# Patient Record
Sex: Male | Born: 1970 | Race: White | Hispanic: No | Marital: Single | Smoking: Never smoker
Health system: Southern US, Community
[De-identification: ages and names within clinical notes are randomized; demographics above are authoritative.]

## PROBLEM LIST (undated history)

## (undated) DIAGNOSIS — I10 Essential (primary) hypertension: Secondary | ICD-10-CM

## (undated) DIAGNOSIS — K2970 Gastritis, unspecified, without bleeding: Secondary | ICD-10-CM

## (undated) DIAGNOSIS — E079 Disorder of thyroid, unspecified: Secondary | ICD-10-CM

## (undated) DIAGNOSIS — M549 Dorsalgia, unspecified: Secondary | ICD-10-CM

## (undated) DIAGNOSIS — F419 Anxiety disorder, unspecified: Secondary | ICD-10-CM

## (undated) DIAGNOSIS — S62109A Fracture of unspecified carpal bone, unspecified wrist, initial encounter for closed fracture: Secondary | ICD-10-CM

## (undated) DIAGNOSIS — G8929 Other chronic pain: Secondary | ICD-10-CM

## (undated) HISTORY — PX: FINGER FRACTURE SURGERY: SHX638

## (undated) HISTORY — PX: APPENDECTOMY: SHX54

---

## 2006-07-17 ENCOUNTER — Emergency Department: Payer: Self-pay | Admitting: General Practice

## 2006-07-20 ENCOUNTER — Other Ambulatory Visit: Payer: Self-pay

## 2006-07-20 ENCOUNTER — Inpatient Hospital Stay: Payer: Self-pay | Admitting: Internal Medicine

## 2006-12-08 ENCOUNTER — Inpatient Hospital Stay: Payer: Self-pay | Admitting: Internal Medicine

## 2006-12-10 ENCOUNTER — Inpatient Hospital Stay: Payer: Self-pay | Admitting: Internal Medicine

## 2007-02-05 ENCOUNTER — Emergency Department: Payer: Self-pay | Admitting: Emergency Medicine

## 2007-05-15 ENCOUNTER — Emergency Department: Payer: Self-pay | Admitting: Emergency Medicine

## 2007-07-16 ENCOUNTER — Emergency Department: Payer: Self-pay | Admitting: Emergency Medicine

## 2008-01-31 ENCOUNTER — Emergency Department: Payer: Self-pay | Admitting: Emergency Medicine

## 2009-06-06 ENCOUNTER — Emergency Department: Payer: Self-pay | Admitting: Emergency Medicine

## 2009-06-21 IMAGING — CT CT ABD-PELV W/ CM
1 of 2 series · 15 of 32 positions shown, 20 images · non-contrast
Comparison: none

REASON FOR EXAM: Diffuse abdominal pain, vomiting, elevated WBC
COMMENTS:

PROCEDURE:     CT  - CT ABDOMEN / PELVIS  W  - May 16, 2007  [DATE]
RESULT:     Emergent CT of the abdomen and pelvis is performed utilizing 100
ml of Rsovue-MLR iodinated intravenous contrast.
Comparison is made to the earlier exam of 12/11/2006.

[Series 2: abdomen · axial · 0.79mm/px · z∈[-920,-504]mm · 15 of 58 slices shown, 20 images]
[im 3/58  soft-tissue]
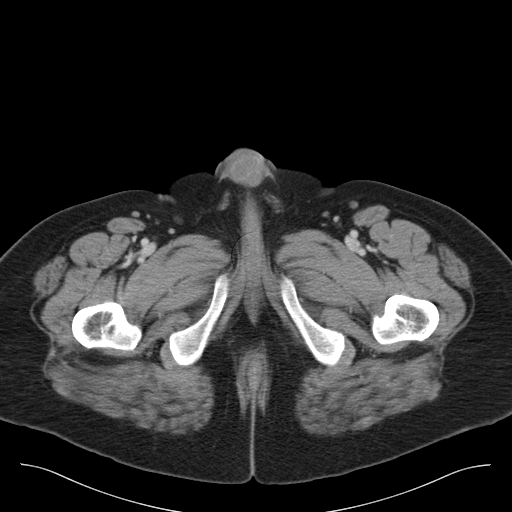
[im 3/58  bone]
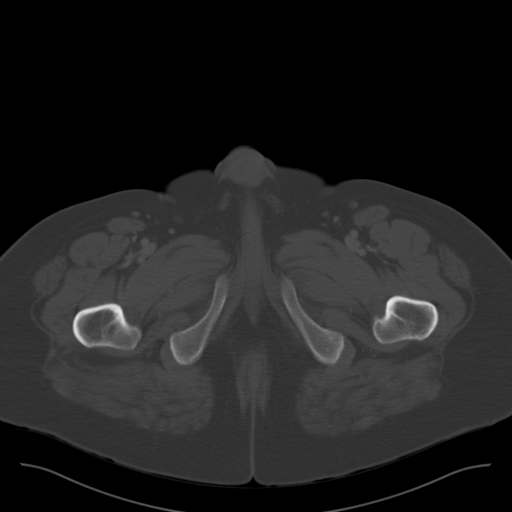
[im 8/58  soft-tissue]
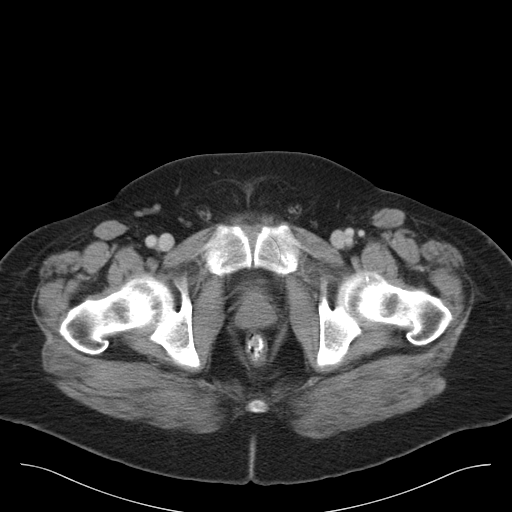
[im 10/58  soft-tissue]
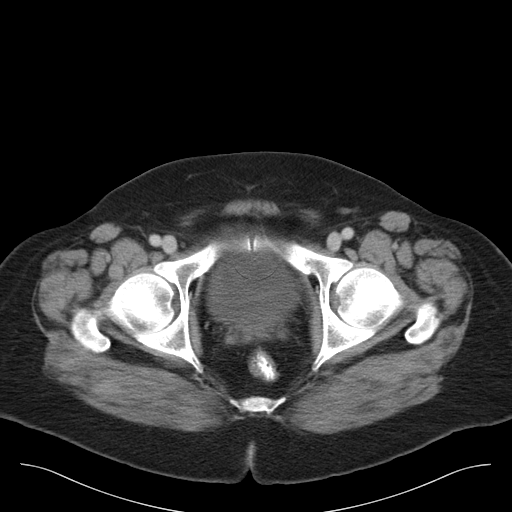
[im 15/58  soft-tissue]
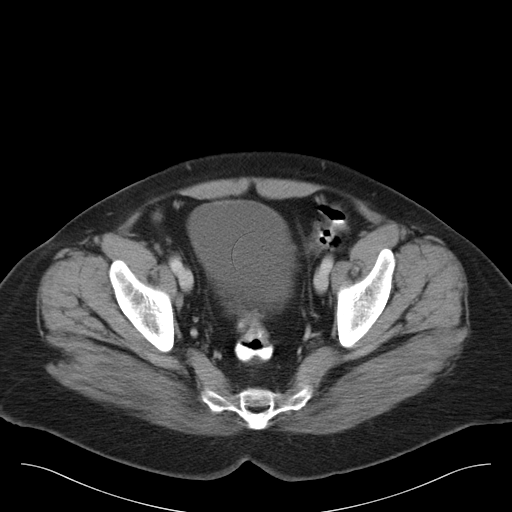
[im 20/58  soft-tissue]
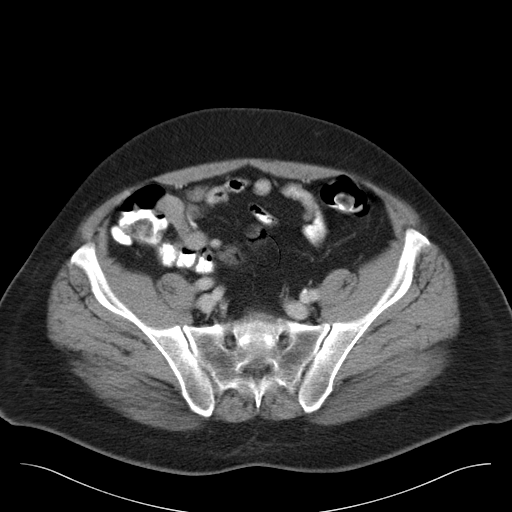
[im 23/58  soft-tissue]
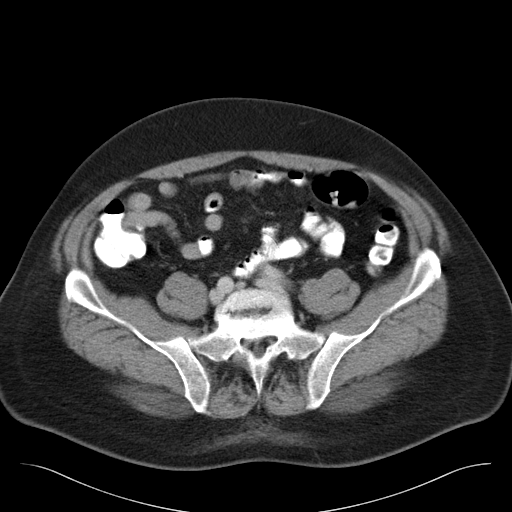
[im 28/58  soft-tissue]
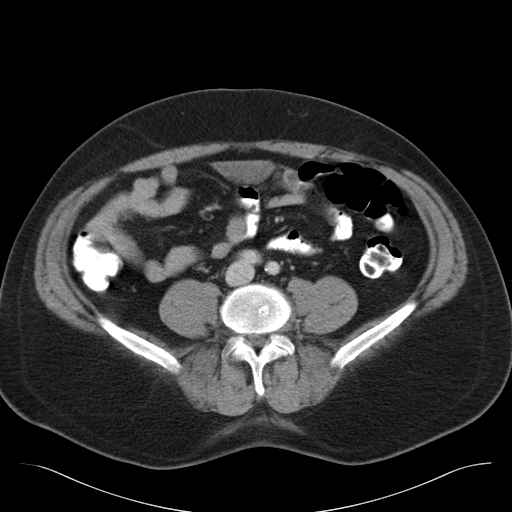
[im 30/58  soft-tissue]
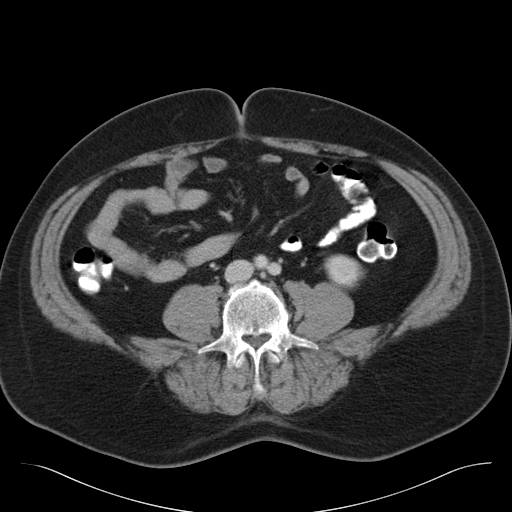
[im 35/58  soft-tissue]
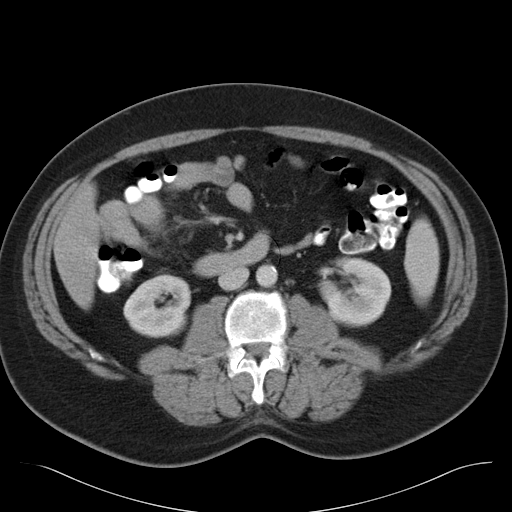
[im 35/58  bone]
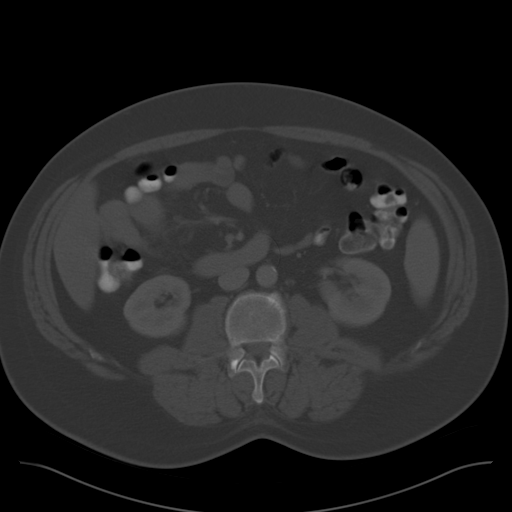
[im 38/58  soft-tissue]
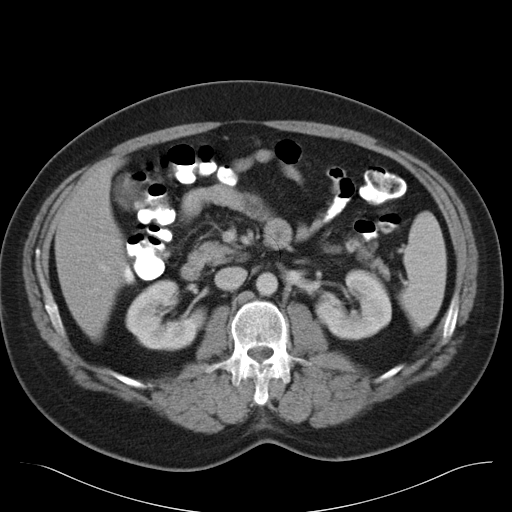
[im 43/58  soft-tissue]
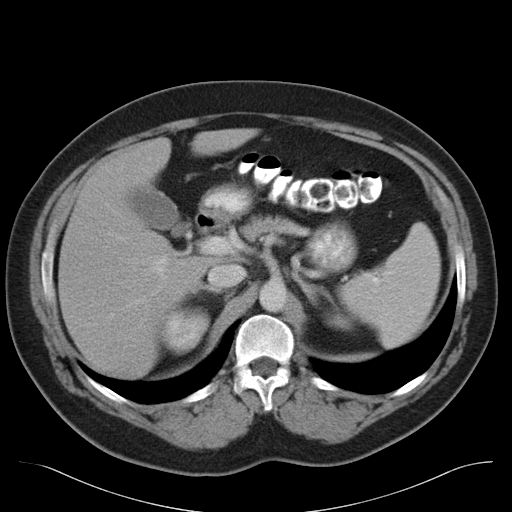
[im 48/58  soft-tissue]
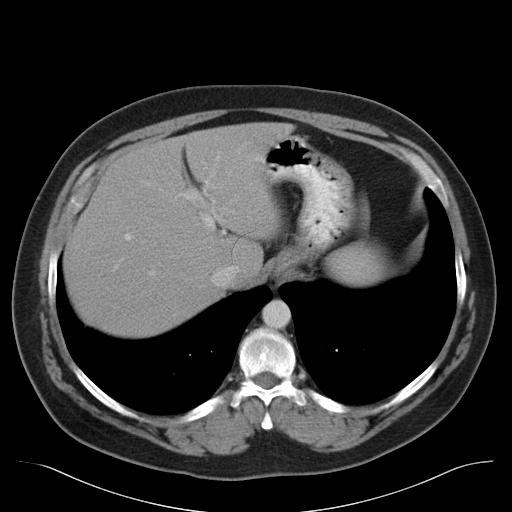
[im 48/58  lung]
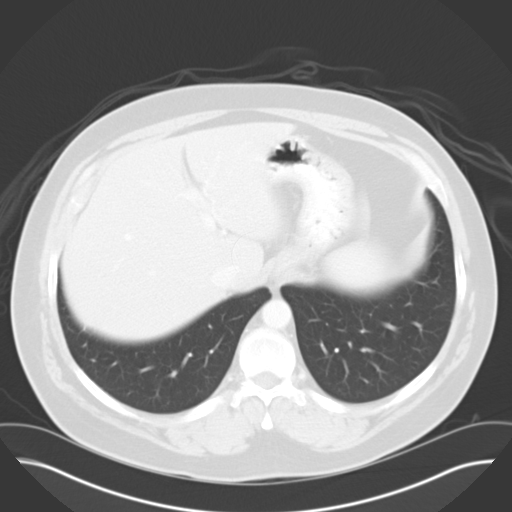
[im 50/58  soft-tissue]
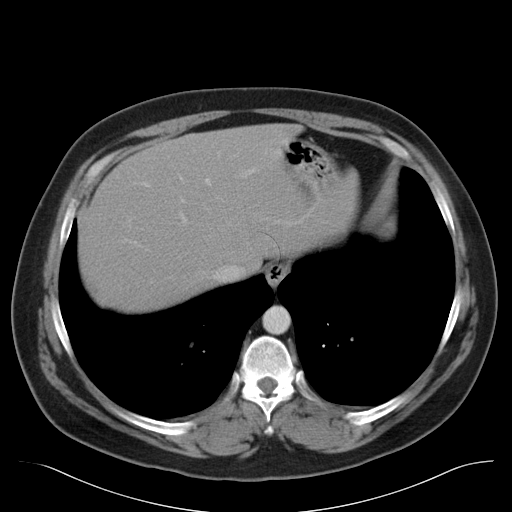
[im 50/58  lung]
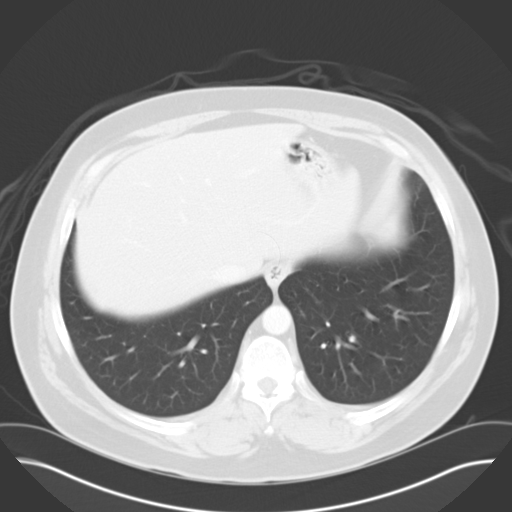
[im 53/58  lung]
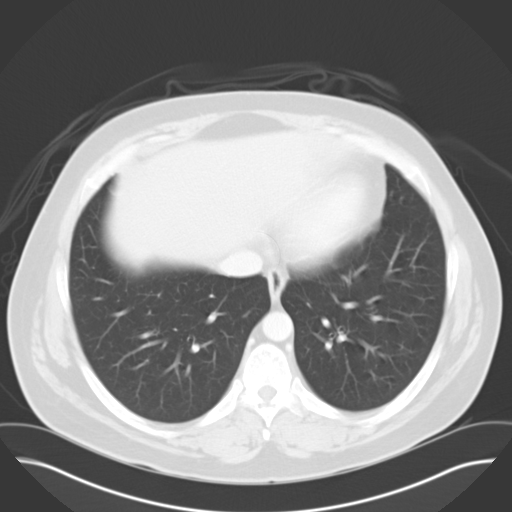
[im 55/58  soft-tissue]
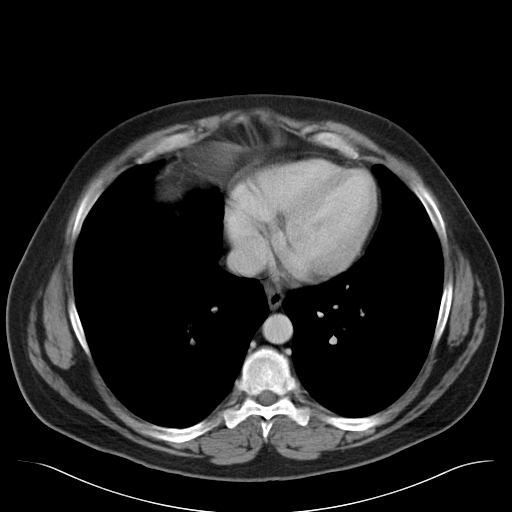
[im 55/58  lung]
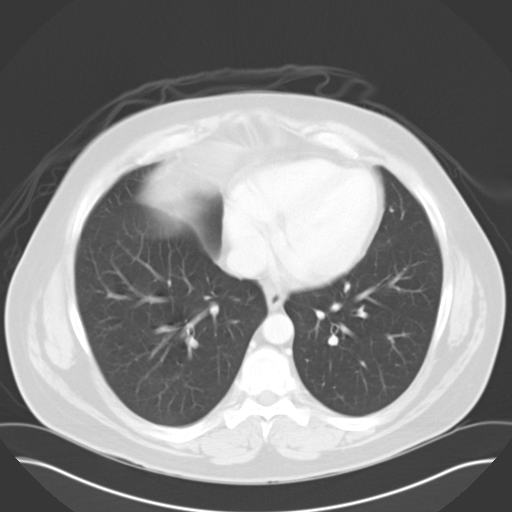

[15 of 32 positions shown; findings below may reference images not displayed]

FINDINGS: The lung bases are clear. The heart is not enlarged. There is no
pleural or pericardial effusion. The liver, spleen, gallbladder, pancreas,
adrenal glands, kidneys, bowel and abdominal aorta appear to be
unremarkable. There is no acute inflammation. The urinary bladder is
nondistended. There is no CT evidence of acute appendicitis. There is no
free air, free fluid or abnormal fluid collection.
IMPRESSION: 1.     No acute abnormality. No gallstones, renal stones or acute
inflammation is evident.
2.     There is no evidence of bowel obstruction.

## 2009-11-25 ENCOUNTER — Observation Stay: Payer: Self-pay | Admitting: Internal Medicine

## 2010-09-15 ENCOUNTER — Emergency Department: Payer: Self-pay | Admitting: Emergency Medicine

## 2011-12-30 ENCOUNTER — Emergency Department: Payer: Self-pay | Admitting: Emergency Medicine

## 2011-12-30 LAB — COMPREHENSIVE METABOLIC PANEL
Anion Gap: 7 (ref 7–16)
BUN: 20 mg/dL — ABNORMAL HIGH (ref 7–18)
Calcium, Total: 9.3 mg/dL (ref 8.5–10.1)
Chloride: 94 mmol/L — ABNORMAL LOW (ref 98–107)
Co2: 27 mmol/L (ref 21–32)
EGFR (African American): >60
Osmolality: 261 (ref 275–301)
Potassium: 4.3 mmol/L (ref 3.5–5.1)
SGOT(AST): 73 U/L — ABNORMAL HIGH (ref 15–37)
SGPT (ALT): 117 U/L — ABNORMAL HIGH (ref 12–78)
Sodium: 128 mmol/L — ABNORMAL LOW (ref 136–145)
Total Protein: 8.7 g/dL — ABNORMAL HIGH (ref 6.4–8.2)

## 2011-12-30 LAB — CBC
RDW: 14.2 % (ref 11.5–14.5)
WBC: 17.3 10*3/uL — ABNORMAL HIGH (ref 3.8–10.6)

## 2011-12-30 LAB — URIC ACID: Uric Acid: 4.3 mg/dL (ref 3.5–7.2)

## 2013-01-20 ENCOUNTER — Emergency Department: Payer: Self-pay | Admitting: Emergency Medicine

## 2013-01-20 LAB — CBC WITH DIFFERENTIAL/PLATELET
BASOS ABS: 0.1 10*3/uL (ref 0.0–0.1)
Basophil %: 0.6 %
EOS ABS: 0.2 10*3/uL (ref 0.0–0.7)
Eosinophil %: 1 %
HCT: 41.1 % (ref 40.0–52.0)
HGB: 13.7 g/dL (ref 13.0–18.0)
Lymphocyte #: 3.3 10*3/uL (ref 1.0–3.6)
Lymphocyte %: 16.8 %
MCH: 29.8 pg (ref 26.0–34.0)
MCHC: 33.4 g/dL (ref 32.0–36.0)
MCV: 89 fL (ref 80–100)
MONOS PCT: 4.4 %
Monocyte #: 0.9 x10 3/mm (ref 0.2–1.0)
NEUTROS ABS: 15.1 10*3/uL — AB (ref 1.4–6.5)
NEUTROS PCT: 77.2 %
Platelet: 263 10*3/uL (ref 150–440)
RBC: 4.61 10*6/uL (ref 4.40–5.90)
RDW: 13.1 % (ref 11.5–14.5)
WBC: 19.5 10*3/uL — ABNORMAL HIGH (ref 3.8–10.6)

## 2013-01-20 LAB — COMPREHENSIVE METABOLIC PANEL
ALT: 31 U/L (ref 12–78)
AST: 30 U/L (ref 15–37)
Albumin: 4.1 g/dL (ref 3.4–5.0)
Alkaline Phosphatase: 78 U/L
Anion Gap: 8 (ref 7–16)
BUN: 28 mg/dL — ABNORMAL HIGH (ref 7–18)
Bilirubin,Total: 1.4 mg/dL — ABNORMAL HIGH (ref 0.2–1.0)
CALCIUM: 8.9 mg/dL (ref 8.5–10.1)
Chloride: 98 mmol/L (ref 98–107)
Co2: 26 mmol/L (ref 21–32)
Creatinine: 1.22 mg/dL (ref 0.60–1.30)
EGFR (Non-African Amer.): >60
Glucose: 112 mg/dL — ABNORMAL HIGH (ref 65–99)
Osmolality: 271 (ref 275–301)
POTASSIUM: 3.5 mmol/L (ref 3.5–5.1)
Sodium: 132 mmol/L — ABNORMAL LOW (ref 136–145)
TOTAL PROTEIN: 7.8 g/dL (ref 6.4–8.2)

## 2013-01-20 LAB — TROPONIN I: Troponin-I: <0.02 ng/mL

## 2013-01-20 LAB — RAPID INFLUENZA A&B ANTIGENS

## 2013-01-20 LAB — LIPASE, BLOOD: Lipase: 79 U/L (ref 73–393)

## 2013-01-21 LAB — URINALYSIS, COMPLETE
BILIRUBIN, UR: NEGATIVE
Bacteria: NONE SEEN
Blood: NEGATIVE
Glucose,UR: NEGATIVE mg/dL (ref 0–75)
Leukocyte Esterase: NEGATIVE
NITRITE: NEGATIVE
Ph: 5 (ref 4.5–8.0)
Protein: 30
RBC,UR: 1 /HPF (ref 0–5)
SPECIFIC GRAVITY: 1.029 (ref 1.003–1.030)
Squamous Epithelial: NONE SEEN
WBC UR: 1 /HPF (ref 0–5)

## 2014-06-05 ENCOUNTER — Ambulatory Visit
Admission: RE | Admit: 2014-06-05 | Discharge: 2014-06-05 | Disposition: A | Discharge status: Home / Self Care | Payer: Disability Insurance | Source: Ambulatory Visit | Attending: Family Medicine | Admitting: Family Medicine

## 2014-06-05 ENCOUNTER — Other Ambulatory Visit: Payer: Self-pay | Admitting: Family Medicine

## 2014-06-05 DIAGNOSIS — M549 Dorsalgia, unspecified: Secondary | ICD-10-CM

## 2014-12-24 ENCOUNTER — Encounter: Payer: Self-pay | Admitting: *Deleted

## 2014-12-24 ENCOUNTER — Emergency Department: Payer: Commercial Managed Care - HMO

## 2014-12-24 ENCOUNTER — Emergency Department
Admission: EM | Admit: 2014-12-24 | Discharge: 2014-12-24 | Disposition: A | Discharge status: Home / Self Care | Payer: Commercial Managed Care - HMO | Attending: Emergency Medicine | Admitting: Emergency Medicine

## 2014-12-24 DIAGNOSIS — S63501A Unspecified sprain of right wrist, initial encounter: Secondary | ICD-10-CM | POA: Diagnosis not present

## 2014-12-24 DIAGNOSIS — W010XXA Fall on same level from slipping, tripping and stumbling without subsequent striking against object, initial encounter: Secondary | ICD-10-CM | POA: Diagnosis not present

## 2014-12-24 DIAGNOSIS — Z880 Allergy status to penicillin: Secondary | ICD-10-CM | POA: Insufficient documentation

## 2014-12-24 DIAGNOSIS — S60211A Contusion of right wrist, initial encounter: Secondary | ICD-10-CM | POA: Diagnosis not present

## 2014-12-24 DIAGNOSIS — Y9389 Activity, other specified: Secondary | ICD-10-CM | POA: Diagnosis not present

## 2014-12-24 DIAGNOSIS — S6991XA Unspecified injury of right wrist, hand and finger(s), initial encounter: Secondary | ICD-10-CM | POA: Diagnosis present

## 2014-12-24 DIAGNOSIS — Y9289 Other specified places as the place of occurrence of the external cause: Secondary | ICD-10-CM | POA: Insufficient documentation

## 2014-12-24 DIAGNOSIS — Y998 Other external cause status: Secondary | ICD-10-CM | POA: Insufficient documentation

## 2014-12-24 HISTORY — DX: Fracture of unspecified carpal bone, unspecified wrist, initial encounter for closed fracture: S62.109A

## 2014-12-24 HISTORY — DX: Gastritis, unspecified, without bleeding: K29.70

## 2014-12-24 NOTE — ED Notes (Signed)
Patient states he fell out of the shower after slipping on conditioner and landed on right wrist.

## 2014-12-24 NOTE — Discharge Instructions (Signed)
Periosteal Hematoma °Periosteal hematoma (bone bruise) is a localized, tender, raised area close to the bone. It can occur from a small hidden fracture of the bone, following surgery, or from other trauma to the area. It typically occurs in bones located close to the surface of the skin, such as the shin, knee, and heel bone. Although it may take 2 or more weeks to completely heal, bone bruises typically are not associated with permanent or serious damage to the bone. If you are taking blood thinners, you may be at greater risk for such injuries.  °CAUSES  °A bone bruise is usually caused by high-impact trauma to the bone, but it can be caused by sports injuries or twisting injuries. °SIGNS AND SYMPTOMS  °· Severe pain around the injured area that typically lasts longer than a normal bruise. °· Difficulty using the bruised area. °· Tender, raised area close to the bone. °· Discoloration or swelling of the bruised area. °DIAGNOSIS  °You may need an MRI of the injured area to confirm a bone bruise if your health care provider feels it is necessary. A regular X-ray will not detect a bone bruise, but it will detect a broken bone (fracture). An X-ray may be taken to rule out any fractures. °TREATMENT  °Often, the best treatment for a bone bruise is resting, icing, and applying cold compresses to the injured area. Over-the-counter medicines may also be recommended for pain control. °HOME CARE INSTRUCTIONS  °Some things you can do to improve the condition are:  °· Rest and elevate the area of injury as long as it is very tender or swollen. °· Apply ice to the injured area: °· Put ice in a plastic bag. °· Place a towel between your skin and the bag. °· Leave the ice on for 20 minutes, 2-3 times a day. °· Use an elastic wrap to reduce swelling and protect the injured area. Make sure it is not applied too tightly. If the area around the wrap becomes cold or blue, the wrap is too tight. Wrap it more loosely. °· For  activity: °· Follow your health care provider's instructions about whether walking with crutches is required. This will depend on how serious your condition is. °· Start weight bearing gradually on the bruised part. °· Continue to use crutches or a cane until you can stand without causing pain, or as instructed. °· If a plaster splint was applied: °· Wear the splint until you are seen for a follow-up exam. °· Rest it on nothing harder than a pillow the first 24 hours. °· Do not put weight on it. °· Do not get it wet. You may take it off to take a shower or bath. °· You may have been given an elastic bandage to use with or without the plaster splint. The splint is too tight if you have numbness or tingling, or if the skin around the bandage becomes cold and blue. Adjust the bandage to make it comfortable. °· If an air splint was applied: °· You may alter the amount of air in the splint as needed for comfort. °· You may take it off at night and to take a shower or bath. °· If the injury was in either leg, wiggle your toes in the splint several times per day if you are able. °· Only take over-the-counter or prescription medicines for pain, discomfort, or fever as directed by your health care provider. °· Keep all follow-up visits with your health care provider. This includes   any orthopedic referrals, physical therapy, and rehabilitation. Any delay in getting necessary care could result in a delay or failure of the bones to heal. SEEK MEDICAL CARE IF:   You have an increase in bruising, swelling, tenderness, heat, or pain over your injury.  You notice coldness of your toes that does not improve after removing a splint or bandage.  Your pain is not lessened after you take medicine.  You have increased difficulty bearing weight on the injured leg, if the injury is in either leg. SEEK IMMEDIATE MEDICAL CARE IF:   You have severe pain near the injured area or severe pain with stretching.  You have increased  swelling that resulted in a tense, hard area or a loss of sensation in the area of the injury.  You have pale, cool skin below the area of the injury (in an extremity) that does not go away after removing a splint or bandage. MAKE SURE YOU:   Understand these instructions.  Will watch your condition.  Will get help right away if you are not doing well or get worse.   This information is not intended to replace advice given to you by your health care provider. Make sure you discuss any questions you have with your health care provider.   Document Released: 02/14/2004 Document Revised: 10/27/2012 Document Reviewed: 06/25/2012 Elsevier Interactive Patient Education 2016 [employer]  Wrist Sprain With Rehab A sprain is an injury in which a ligament that maintains the proper alignment of a joint is partially or completely torn. The ligaments of the wrist are susceptible to sprains. Sprains are classified into three categories. Grade 1 sprains cause pain, but the tendon is not lengthened. Grade 2 sprains include a lengthened ligament because the ligament is stretched or partially ruptured. With grade 2 sprains there is still function, although the function may be diminished. Grade 3 sprains are characterized by a complete tear of the tendon or muscle, and function is usually impaired. SYMPTOMS   Pain tenderness, inflammation, and/or bruising (contusion) of the injury.  A "pop" or tear felt and/or heard at the time of injury.  Decreased wrist function. CAUSES  A wrist sprain occurs when a force is placed on one or more ligaments that is greater than it/they can withstand. Common mechanisms of injury include:  Catching a ball with your hands.  Repetitive and/ or strenuous extension or flexion of the wrist. RISK INCREASES WITH:  Previous wrist injury.  Contact sports (boxing or wrestling).  Activities in which falling is common.  Poor strength and flexibility.  Improperly fitted or  padded protective equipment. PREVENTION  Warm up and stretch properly before activity.  Allow for adequate recovery between workouts.  Maintain physical fitness:  Strength, flexibility, and endurance.  Cardiovascular fitness.  Protect the wrist joint by limiting its motion with the use of taping, braces, or splints.  Protect the wrist after injury for 6 to 12 months. PROGNOSIS  The prognosis for wrist sprains depends on the degree of injury. Grade 1 sprains require 2 to 6 weeks of treatment. Grade 2 sprains require 6 to 8 weeks of treatment, and grade 3 sprains require up to 12 weeks.  RELATED COMPLICATIONS   Prolonged healing time, if improperly treated or re-injured.  Recurrent symptoms that result in a chronic problem.  Injury to nearby structures (bone, cartilage, nerves, or tendons).  Arthritis of the wrist.  Inability to compete in athletics at a high level.  Wrist stiffness or weakness.  Progression to a  complete rupture of the ligament. TREATMENT  Treatment initially involves resting from any activities that aggravate the symptoms, and the use of ice and medications to help reduce pain and inflammation. Your caregiver may recommend immobilizing the wrist for a period of time in order to reduce stress on the ligament and allow for healing. After immobilization it is important to perform strengthening and stretching exercises to help regain strength and a full range of motion. These exercises may be completed at home or with a therapist. Surgery is not usually required for wrist sprains, unless the ligament has been ruptured (grade 3 sprain). MEDICATION   If pain medication is necessary, then nonsteroidal anti-inflammatory medications, such as aspirin and ibuprofen, or other minor pain relievers, such as acetaminophen, are often recommended.  Do not take pain medication for 7 days before surgery.  Prescription pain relievers may be given if deemed necessary by your  caregiver. Use only as directed and only as much as you need. HEAT AND COLD  Cold treatment (icing) relieves pain and reduces inflammation. Cold treatment should be applied for 10 to 15 minutes every 2 to 3 hours for inflammation and pain and immediately after any activity that aggravates your symptoms. Use ice packs or massage the area with a piece of ice (ice massage).  Heat treatment may be used prior to performing the stretching and strengthening activities prescribed by your caregiver, physical therapist, or athletic trainer. Use a heat pack or soak your injury in warm water. SEEK MEDICAL CARE IF:  Treatment seems to offer no benefit, or the condition worsens.  Any medications produce adverse side effects.

## 2014-12-24 NOTE — ED Notes (Signed)
Pt fell in the shower tonight.  Pt has right wrist pain.  Swelling noted.  Denies other injury at this time.

## 2014-12-24 NOTE — ED Provider Notes (Signed)
[facility] Emergency Department Provider Note  ____________________________________________  Time seen: Approximately 7:31 PM  I have reviewed the triage vital signs and the nursing notes.   HISTORY  Chief Complaint Fall    HPI Kevin Schmitt is a 44 y.o. male who presents to emergency department complaining ofright wrist pain. He states that he was in the shower when he slipped falling on her outstretched hand. He reports immediate severe pain to the distal ulna and radius. He reports swelling to the same area. He states that he does have a history of a greenstick fracture to distal ulna approximately 25 years prior. He denies numbness or tingling in his fingers. He denies hitting his head or losing consciousness. He states the pain is sharp, severe at the time of incident but moderate now, and worse with movement. Patient states that he has a pain management contract for chronic lumbago and narcotic use is dulling the pain.   Past Medical History  Diagnosis Date  . Gastritis   . Wrist fracture right    There are no active problems to display for this patient.   Past Surgical History  Procedure Laterality Date  . Appendectomy    . Finger fracture surgery      No current outpatient prescriptions on file.  Allergies Penicillins  History reviewed. No pertinent family history.  Social History Social History  Substance Use Topics  . Smoking status: Never Smoker   . Smokeless tobacco: None  . Alcohol Use: No    Review of Systems Constitutional: No fever/chills Eyes: No visual changes. ENT: No sore throat. Cardiovascular: Denies chest pain. Respiratory: Denies shortness of breath. Gastrointestinal: No abdominal pain.  No nausea, no vomiting.  No diarrhea.  No constipation. Genitourinary: Negative for dysuria. Musculoskeletal: Negative for back pain. Patient endorses right wrist pain. Skin: Negative for rash. Neurological: Negative for  headaches, focal weakness or numbness.  10-point ROS otherwise negative.  ____________________________________________   PHYSICAL EXAM:  VITAL SIGNS: ED Triage Vitals  Enc Vitals Group     BP 12/24/14 1900 130/69 mmHg     Pulse Rate 12/24/14 1900 54     Resp 12/24/14 1900 18     Temp 12/24/14 1900 98.5 F (36.9 C)     Temp Source 12/24/14 1900 Oral     SpO2 12/24/14 1900 99 %     Weight 12/24/14 1900 220 lb (99.791 kg)     Height 12/24/14 1900  (1.778 m)     Head Cir --      Peak Flow --      Pain Score 12/24/14 1901 4     Pain Loc --      Pain Edu? --      Excl. in GC? --     Constitutional: Alert and oriented. Well appearing and in no acute distress. Eyes: Conjunctivae are normal. PERRL. EOMI. Head: Atraumatic. Nose: No congestion/rhinnorhea. Mouth/Throat: Mucous membranes are moist.  Oropharynx non-erythematous. Neck: No stridor.   Cardiovascular: Normal rate, regular rhythm. Grossly normal heart sounds.  Good peripheral circulation. Respiratory: Normal respiratory effort.  No retractions. Lungs CTAB. Gastrointestinal: Soft and nontender. No distention. No abdominal bruits. No CVA tenderness. Musculoskeletal: No lower extremity tenderness nor edema.  No joint effusions. Edema noted to distal radius and ulna with compared with left side. Limited range of motion of wrist due to pain. Tenderness to palpation over distal ulna and radius, however tenderness is worse over the ulna. Neurologic:  Normal speech and language.  No gross focal neurologic deficits are appreciated. No gait instability. Skin:  Skin is warm, dry and intact. No rash noted. Psychiatric: Mood and affect are normal. Speech and behavior are normal.  ____________________________________________   LABS (all labs ordered are listed, but only abnormal results are displayed)  Labs Reviewed - No data to  display ____________________________________________  EKG   ____________________________________________  RADIOLOGY  Right wrist x-ray Impression: No acute fracture. No dislocation. ____________________________________________   PROCEDURES  Procedure(s) performed: Yes, joint immobilization, see procedure note(s).   SPLINT APPLICATION Date/Time: 8:38 PM Authorized by: Racheal PatchesJonathan D Cuthriell Consent: Verbal consent obtained. Risks and benefits: risks, benefits and alternatives were discussed Consent given by: patient Splint applied by: Gala RomneyJonathan Cuthriell, PA-C Location details: Right wrist  Splint type: Wrist cock up  Supplies used: Prefabricated wrist brace  Post-procedure: The splinted body part was neurovascularly unchanged following the procedure. Patient tolerance: Patient tolerated the procedure well with no immediate complications.     Critical Care performed: No  ____________________________________________   INITIAL IMPRESSION / ASSESSMENT AND PLAN / ED COURSE  Pertinent labs & imaging results that were available during my care of the patient were reviewed by me and considered in my medical decision making (see chart for details).  Patient's diagnosis is consistent with right wrist sprain and contusion. Patient is given a prefabricated wrist brace here in emergency department. Patient is on pain management contract and is advised to continue readily prescribed medications for same. Patient may take ibuprofen in addition to narcotics for symptom control. Patient is to follow-up with orthopedics should symptoms persist. ____________________________________________   FINAL CLINICAL IMPRESSION(S) / ED DIAGNOSES  Final diagnoses:  Wrist sprain, right, initial encounter  Wrist contusion, right, initial encounter      Racheal PatchesJonathan D Cuthriell, PA-C 12/24/14 2038  Sharman CheekPhillip Stafford, MD 12/24/14 2311

## 2015-06-17 ENCOUNTER — Emergency Department: Payer: Medicare PPO

## 2015-06-17 ENCOUNTER — Emergency Department
Admission: EM | Admit: 2015-06-17 | Discharge: 2015-06-17 | Disposition: A | Discharge status: Home / Self Care | Payer: Medicare PPO | Attending: Emergency Medicine | Admitting: Emergency Medicine

## 2015-06-17 ENCOUNTER — Encounter: Payer: Self-pay | Admitting: Emergency Medicine

## 2015-06-17 DIAGNOSIS — R112 Nausea with vomiting, unspecified: Secondary | ICD-10-CM | POA: Diagnosis present

## 2015-06-17 DIAGNOSIS — K2900 Acute gastritis without bleeding: Secondary | ICD-10-CM | POA: Insufficient documentation

## 2015-06-17 DIAGNOSIS — I10 Essential (primary) hypertension: Secondary | ICD-10-CM | POA: Insufficient documentation

## 2015-06-17 DIAGNOSIS — F1290 Cannabis use, unspecified, uncomplicated: Secondary | ICD-10-CM | POA: Insufficient documentation

## 2015-06-17 DIAGNOSIS — R109 Unspecified abdominal pain: Secondary | ICD-10-CM

## 2015-06-17 DIAGNOSIS — D72829 Elevated white blood cell count, unspecified: Secondary | ICD-10-CM | POA: Diagnosis not present

## 2015-06-17 DIAGNOSIS — E079 Disorder of thyroid, unspecified: Secondary | ICD-10-CM | POA: Insufficient documentation

## 2015-06-17 DIAGNOSIS — Z79899 Other long term (current) drug therapy: Secondary | ICD-10-CM | POA: Insufficient documentation

## 2015-06-17 DIAGNOSIS — R110 Nausea: Secondary | ICD-10-CM

## 2015-06-17 HISTORY — DX: Anxiety disorder, unspecified: F41.9

## 2015-06-17 HISTORY — DX: Essential (primary) hypertension: I10

## 2015-06-17 HISTORY — DX: Disorder of thyroid, unspecified: E07.9

## 2015-06-17 LAB — COMPREHENSIVE METABOLIC PANEL
ALT: 28 U/L (ref 17–63)
AST: 37 U/L (ref 15–41)
Albumin: 4.4 g/dL (ref 3.5–5.0)
Alkaline Phosphatase: 70 U/L (ref 38–126)
Anion gap: 15 (ref 5–15)
BILIRUBIN TOTAL: 2.6 mg/dL — AB (ref 0.3–1.2)
BUN: 26 mg/dL — ABNORMAL HIGH (ref 6–20)
CALCIUM: 9 mg/dL (ref 8.9–10.3)
CO2: 23 mmol/L (ref 22–32)
Chloride: 93 mmol/L — ABNORMAL LOW (ref 101–111)
Creatinine, Ser: 1.38 mg/dL — ABNORMAL HIGH (ref 0.61–1.24)
GFR calc Af Amer: >60 mL/min (ref >60)
GFR, EST NON AFRICAN AMERICAN: 60 mL/min — AB (ref >60)
Glucose, Bld: 116 mg/dL — ABNORMAL HIGH (ref 65–99)
Potassium: 3.6 mmol/L (ref 3.5–5.1)
Sodium: 131 mmol/L — ABNORMAL LOW (ref 135–145)
Total Protein: 7.8 g/dL (ref 6.5–8.1)

## 2015-06-17 LAB — CBC WITH DIFFERENTIAL/PLATELET
Basophils Absolute: 0.1 10*3/uL (ref 0–0.1)
Eosinophils Absolute: 0 10*3/uL (ref 0–0.7)
Eosinophils Relative: 0 %
HEMATOCRIT: 40.2 % (ref 40.0–52.0)
HEMOGLOBIN: 13.6 g/dL (ref 13.0–18.0)
Lymphocytes Relative: 13 %
Lymphs Abs: 2.1 10*3/uL (ref 1.0–3.6)
MCH: 30.2 pg (ref 26.0–34.0)
MCHC: 33.7 g/dL (ref 32.0–36.0)
MCV: 89.5 fL (ref 80.0–100.0)
Monocytes Absolute: 1 10*3/uL (ref 0.2–1.0)
NEUTROS ABS: 12.9 10*3/uL — AB (ref 1.4–6.5)
Platelets: 179 10*3/uL (ref 150–440)
RBC: 4.49 MIL/uL (ref 4.40–5.90)
RDW: 13.2 % (ref 11.5–14.5)
WBC: 16.1 10*3/uL — AB (ref 3.8–10.6)

## 2015-06-17 LAB — URINALYSIS COMPLETE WITH MICROSCOPIC (ARMC ONLY)
Bilirubin Urine: NEGATIVE
Glucose, UA: NEGATIVE mg/dL
Leukocytes, UA: NEGATIVE
NITRITE: NEGATIVE
Protein, ur: 30 mg/dL — AB
RBC / HPF: NONE SEEN RBC/hpf (ref 0–5)
SPECIFIC GRAVITY, URINE: 1.008 (ref 1.005–1.030)
Squamous Epithelial / LPF: NONE SEEN
pH: 5 (ref 5.0–8.0)

## 2015-06-17 LAB — LIPASE, BLOOD: Lipase: 18 U/L (ref 11–51)

## 2015-06-17 LAB — CBC
HCT: 41.2 % (ref 40.0–52.0)
HEMOGLOBIN: 13.9 g/dL (ref 13.0–18.0)
MCH: 30.5 pg (ref 26.0–34.0)
MCHC: 33.8 g/dL (ref 32.0–36.0)
MCV: 90.2 fL (ref 80.0–100.0)
Platelets: 213 10*3/uL (ref 150–440)
RBC: 4.56 MIL/uL (ref 4.40–5.90)
RDW: 13.1 % (ref 11.5–14.5)
WBC: 23.2 10*3/uL — ABNORMAL HIGH (ref 3.8–10.6)

## 2015-06-17 MED ORDER — HALOPERIDOL LACTATE 5 MG/ML IJ SOLN
1.0000 mg | Freq: Once | INTRAMUSCULAR | Status: AC
  Administered 2015-06-17: 1 mg via INTRAVENOUS
  Filled 2015-06-17: qty 1

## 2015-06-17 MED ORDER — SODIUM CHLORIDE 0.9 % IV BOLUS (SEPSIS)
1000.0000 mL | INTRAVENOUS | Status: AC
  Administered 2015-06-17: 1000 mL via INTRAVENOUS

## 2015-06-17 MED ORDER — LORAZEPAM 2 MG/ML IJ SOLN
1.0000 mg | Freq: Once | INTRAMUSCULAR | Status: AC
  Administered 2015-06-17: 1 mg via INTRAVENOUS
  Filled 2015-06-17: qty 1

## 2015-06-17 MED ORDER — ONDANSETRON HCL 4 MG/2ML IJ SOLN
4.0000 mg | Freq: Once | INTRAMUSCULAR | Status: AC | PRN
  Administered 2015-06-17: 4 mg via INTRAVENOUS
  Filled 2015-06-17: qty 2

## 2015-06-17 MED ORDER — MORPHINE SULFATE (PF) 4 MG/ML IV SOLN
8.0000 mg | Freq: Once | INTRAVENOUS | Status: AC
  Administered 2015-06-17: 8 mg via INTRAVENOUS
  Filled 2015-06-17: qty 2

## 2015-06-17 MED ORDER — DIPHENHYDRAMINE HCL 50 MG/ML IJ SOLN
25.0000 mg | Freq: Once | INTRAMUSCULAR | Status: AC
  Administered 2015-06-17: 25 mg via INTRAVENOUS
  Filled 2015-06-17: qty 1

## 2015-06-17 MED ORDER — ONDANSETRON HCL 4 MG/2ML IJ SOLN
4.0000 mg | Freq: Once | INTRAMUSCULAR | Status: AC
  Administered 2015-06-17: 4 mg via INTRAVENOUS
  Filled 2015-06-17: qty 2

## 2015-06-17 MED ORDER — MORPHINE SULFATE (PF) 4 MG/ML IV SOLN
6.0000 mg | Freq: Once | INTRAVENOUS | Status: AC
  Administered 2015-06-17: 6 mg via INTRAVENOUS
  Filled 2015-06-17: qty 2

## 2015-06-17 MED ORDER — SODIUM CHLORIDE 0.9 % IV BOLUS (SEPSIS)
1000.0000 mL | Freq: Once | INTRAVENOUS | Status: AC
  Administered 2015-06-17: 1000 mL via INTRAVENOUS

## 2015-06-17 MED ORDER — ONDANSETRON HCL 4 MG/2ML IJ SOLN
INTRAMUSCULAR | Status: AC
  Filled 2015-06-17: qty 2

## 2015-06-17 MED ORDER — DIATRIZOATE MEGLUMINE & SODIUM 66-10 % PO SOLN
15.0000 mL | Freq: Once | ORAL | Status: AC
  Administered 2015-06-17: 15 mL via ORAL

## 2015-06-17 MED ORDER — IOPAMIDOL (ISOVUE-300) INJECTION 61%
100.0000 mL | Freq: Once | INTRAVENOUS | Status: AC | PRN
  Administered 2015-06-17: 100 mL via INTRAVENOUS

## 2015-06-17 NOTE — Discharge Instructions (Signed)
Abdominal Pain, Adult Many things can cause abdominal pain. Usually, abdominal pain is not caused by a disease and will improve without treatment. It can often be observed and treated at home. Your health care provider will do a physical exam and possibly order blood tests and X-rays to help determine the seriousness of your pain. However, in many cases, more time must pass before a clear cause of the pain can be found. Before that point, your health care provider may not know if you need more testing or further treatment. HOME CARE INSTRUCTIONS Monitor your abdominal pain for any changes. The following actions may help to alleviate any discomfort you are experiencing:  Only take over-the-counter or prescription medicines as directed by your health care provider.  Do not take laxatives unless directed to do so by your health care provider.  Try a clear liquid diet (broth, tea, or water) as directed by your health care provider. Slowly move to a bland diet as tolerated. SEEK MEDICAL CARE IF:  You have unexplained abdominal pain.  You have abdominal pain associated with nausea or diarrhea.  You have pain when you urinate or have a bowel movement.  You experience abdominal pain that wakes you in the night.  You have abdominal pain that is worsened or improved by eating food.  You have abdominal pain that is worsened with eating fatty foods.  You have a fever. SEEK IMMEDIATE MEDICAL CARE IF:  Your pain does not go away within 2 hours.  You keep throwing up (vomiting).  Your pain is felt only in portions of the abdomen, such as the right side or the left lower portion of the abdomen.  You pass bloody or black tarry stools. MAKE SURE YOU:  Understand these instructions.  Will watch your condition.  Will get help right away if you are not doing well or get worse.   This information is not intended to replace advice given to you by your health care provider. Make sure you discuss  any questions you have with your health care provider.   Document Released: 10/16/2004 Document Revised: 09/27/2014 Document Reviewed: 09/15/2012 Elsevier Interactive Patient Education [employer]2016 [employer]   Please return if you're worse. Resume all your medicines. Don't forget to pick up the antibiotic from pharmacy. I would get Dr. Mechele CollinElliott to refer you to the hematologist to evaluate her chronically elevated white blood count. Do not have anybody that is being on-call for them today.

## 2015-06-17 NOTE — ED Notes (Signed)
Pt reports that he has been vomiting since Tuesday. Pt reports that he has taken no medication including BP meds and Xanax. Pt appears anxious and trembling but is a/o with NAD at this time in triage.

## 2015-06-17 NOTE — ED Provider Notes (Signed)
[facility] Emergency Department Provider Note  ____________________________________________  Time seen: Approximately 5:21 AM  I have reviewed the triage vital signs and the nursing notes.   HISTORY  Chief Complaint Nausea    HPI DARSH VANDEVOORT is a 45 y.o. male who reports a history of chronic back pain under contract with a pain management clinic in [facility], chronic gastritis, anxiety on chronic Xanax, who presents by private vehicle for evaluation of nausea and vomiting for about 5 days.  He reports that it started acutely and has been persistent and severe.  Nothing is making it better or worse.  He has abdominal pain and back pain that feel the same as usual except it is worse because he has not been able to keep down his narcotics.  He also feels very jittery and anxious because he has not been able to take his Xanax; he states that anytime he has anything to eat or drink comes back up.  He denies fever/chills, chest pain, shortness of breath, dysuria.  He states that the symptoms are severe but that they feel very similar to prior episodes of acute gastritis that he has had in the past.  He denies diarrhea and constipation.   Past Medical History  Diagnosis Date  . Gastritis   . Wrist fracture right  . Hypertension   . Thyroid disease   . Anxiety     There are no active problems to display for this patient.   Past Surgical History  Procedure Laterality Date  . Appendectomy    . Finger fracture surgery      Current Outpatient Rx  Name  Route  Sig  Dispense  Refill  . ALPRAZolam (XANAX) 1 MG tablet   Oral   Take 1 mg by mouth at bedtime.           Dispense as written.   Marland Kitchen levothyroxine (SYNTHROID, LEVOTHROID) 50 MCG tablet   Oral   Take 50 mcg by mouth daily before breakfast.           Dispense as written.   Marland Kitchen lisinopril (PRINIVIL,ZESTRIL) 40 MG tablet   Oral   Take 40 mg by mouth daily.           Dispense as  written.   . methadone (DOLOPHINE) 10 MG tablet   Oral   Take 10 mg by mouth every 8 (eight) hours.           Dispense as written.   . metoprolol succinate (TOPROL-XL) 50 MG 24 hr tablet   Oral   Take 50 mg by mouth daily. Take with or immediately following a meal.           Dispense as written.   Marland Kitchen oxycodone (ROXICODONE) 30 MG immediate release tablet   Oral   Take 30 mg by mouth every 4 (four) hours as needed for pain.           Dispense as written.   . pantoprazole (PROTONIX) 40 MG tablet   Oral   Take 40 mg by mouth 2 (two) times daily.           Dispense as written.   . rosuvastatin (CRESTOR) 40 MG tablet   Oral   Take 40 mg by mouth daily.           Dispense as written.     Allergies Penicillins  No family history on file.  Social History Social History  Substance Use Topics  .  Smoking status: Never Smoker   . Smokeless tobacco: None  . Alcohol Use: No    Review of Systems Constitutional: No fever/chills Eyes: No visual changes. ENT: No sore throat. Cardiovascular: Denies chest pain. Respiratory: Denies shortness of breath. Gastrointestinal: N/V x 5 days.  +abd pain.  Denies diarrhea/constipation Genitourinary: Negative for dysuria. Musculoskeletal: Negative for back pain. Skin: Negative for rash. Neurological: Negative for headaches, focal weakness or numbness.  Anxious, tremulous.  10-point ROS otherwise negative.  ____________________________________________   PHYSICAL EXAM:  VITAL SIGNS: ED Triage Vitals  Enc Vitals Group     BP 06/17/15 0510 167/110 mmHg     Pulse Rate 06/17/15 0510 72     Resp 06/17/15 0510 20     Temp 06/17/15 0510 98.6 F (37 C)     Temp Source 06/17/15 0510 Oral     SpO2 06/17/15 0510 95 %     Weight 06/17/15 0510 210 lb (95.255 kg)     Height 06/17/15 0510 5\' 10"  (1.778 m)     Head Cir --      Peak Flow --      Pain Score 06/17/15 0513 7     Pain Loc --      Pain Edu? --      Excl. in GC? --      Constitutional: Alert and oriented. Well appearing but is tremulous and anxious. Eyes: Conjunctivae are normal. PERRL. EOMI. Head: Atraumatic. Nose: No congestion/rhinnorhea. Mouth/Throat: Mucous membranes are moist.  Oropharynx non-erythematous. Neck: No stridor.  No meningeal signs.   Cardiovascular: Normal rate, regular rhythm. Good peripheral circulation. Grossly normal heart sounds.   Respiratory: Normal respiratory effort.  No retractions. Lungs CTAB. Gastrointestinal: Soft w/ mild tenderness throughout.  No rebound nor guarding. Musculoskeletal: No lower extremity tenderness nor edema. No gross deformities of extremities. Neurologic:  Normal speech and language. No gross focal neurologic deficits are appreciated.  Skin:  Skin is warm, dry and intact. No rash noted. Psychiatric: Mood and affect are normal. Speech and behavior are normal.  Anxious/tremulous.  ____________________________________________   LABS (all labs ordered are listed, but only abnormal results are displayed)  Labs Reviewed  COMPREHENSIVE METABOLIC PANEL - Abnormal; Notable for the following:    Sodium 131 (*)    Chloride 93 (*)    Glucose, Bld 116 (*)    BUN 26 (*)    Creatinine, Ser 1.38 (*)    Total Bilirubin 2.6 (*)    GFR calc non Af Amer 60 (*)    All other components within normal limits  CBC - Abnormal; Notable for the following:    WBC 23.2 (*)    All other components within normal limits  LIPASE, BLOOD  URINALYSIS COMPLETEWITH MICROSCOPIC ([facility] ONLY)   ____________________________________________  EKG  ED ECG REPORT I, Arlington Sigmund, the attending physician, personally viewed and interpreted this ECG.  Date: 06/17/2015 EKG Time: 05:16 Rate: 73 Rhythm: normal sinus rhythm QRS Axis: normal Intervals: normal (QTC 478 ms) ST/T Wave abnormalities: normal Conduction Disturbances: none Narrative Interpretation:  unremarkable  ____________________________________________  RADIOLOGY   No results found.  ____________________________________________   PROCEDURES  Procedure(s) performed: None  Critical Care performed: No ____________________________________________   INITIAL IMPRESSION / ASSESSMENT AND PLAN / ED COURSE  Pertinent labs & imaging results that were available during my care of the patient were reviewed by me and considered in my medical decision making (see chart for details).  Suspect the patient is having acute on chronic episode of gastritis.  His vitals are normal at this time other than an elevated blood pressure which is likely secondary to him not taking his antihypertensives.  If we can control his nausea and vomiting he will likely be able to take his medications and follow-up as an outpatient.  Given that his Zofran ODT has not been working at home I will give him Haldol 1 mg IV and Benadryl 25 mg.  Additionally I believe he is withdrawing from benzodiazepines and will give him Ativan 1 mg IV.  I am attempting to check the [place] controlled substances database for his prescribed medications, but the database is not responding at this time.  The nausea and vomiting is describing may himself be the result of Xanax withdrawal, but I are explained I will not be providing prescriptions for narcotics nor benzos.  He understands and agrees with this plan.  ----------------------------------------- 6:58 AM on 06/17/2015 -----------------------------------------  I verified the patient is on heavy narcotics and Xanax via the [place] controlled substance database.  However his labs are grossly abnormal with a white blood cell count of nearly 24 and elevated bilirubin, although apparently there is some hemolysis that is affecting this.  Given the degree of his lab abnormalities, however, I think it is appropriate to proceed with a CT scan of his abdomen and pelvis.  He  does not have one on record since 2011.  I discussed this with him and he is in agreement.  Additionally have ordered another dose of Haldol 1 mg IV for his nausea which is persistent but improved from prior and I ordered morphine 8 mg IV given his persistent pain and the fact that he takes chronic narcotics but has not been able to take any due to vomiting.  Transferred ED care to Dr. Darnelle Catalan at 07:00 and discussed plan of care.  ____________________________________________  FINAL CLINICAL IMPRESSION(S) / ED DIAGNOSES  Final diagnoses:  Abdominal pain, unspecified abdominal location  Acute gastritis without hemorrhage  Leukocytosis  Hyperbilirubinemia     MEDICATIONS GIVEN DURING THIS VISIT:  Medications  haloperidol lactate (HALDOL) injection 1 mg (not administered)  sodium chloride 0.9 % bolus 1,000 mL (not administered)  morphine 4 MG/ML injection 8 mg (not administered)  ondansetron (ZOFRAN) injection 4 mg (4 mg Intravenous Given 06/17/15 0523)  sodium chloride 0.9 % bolus 1,000 mL (1,000 mLs Intravenous New Bag/Given 06/17/15 0523)  LORazepam (ATIVAN) injection 1 mg (1 mg Intravenous Given 06/17/15 0552)  haloperidol lactate (HALDOL) injection 1 mg (1 mg Intravenous Given 06/17/15 0552)  diphenhydrAMINE (BENADRYL) injection 25 mg (25 mg Intravenous Given 06/17/15 0553)     NEW OUTPATIENT MEDICATIONS STARTED DURING THIS VISIT:  New Prescriptions   No medications on file      Note:  This document was prepared using Dragon voice recognition software and may include unintentional dictation errors.   Loleta Rose, MD 06/17/15 0700

## 2015-06-17 NOTE — ED Notes (Signed)
Pt verbalized understanding of discharge instructions. NAD at this time. 

## 2015-06-17 NOTE — ED Provider Notes (Signed)
CT of the patient's head return shows no acute pathology. White blood count is coming down. Patient is now able to tolerate by mouth but wants some Ativan and pain medicine as he has been unable to take any of his medicines today. He is afraid if he starts taking anything right now he might begin vomiting again. I do not have a problem with that. Patient has antibiotics at the pharmacy which she has to pick up for his cough. Patient hasn't appointment with Dr. Mechele CollinElliott for follow-up. I recommending he goes to see hematology oncology as well because he reports that he's had a high white blood count very often for  quite some time.  Arnaldo NatalPaul F Malinda, MD 06/17/15 1324

## 2015-06-22 ENCOUNTER — Observation Stay
Admission: EM | Admit: 2015-06-22 | Discharge: 2015-06-23 | Disposition: A | Discharge status: Home / Self Care | Payer: Medicare PPO | Attending: Specialist | Admitting: Specialist

## 2015-06-22 DIAGNOSIS — I129 Hypertensive chronic kidney disease with stage 1 through stage 4 chronic kidney disease, or unspecified chronic kidney disease: Secondary | ICD-10-CM | POA: Diagnosis not present

## 2015-06-22 DIAGNOSIS — Z880 Allergy status to penicillin: Secondary | ICD-10-CM | POA: Insufficient documentation

## 2015-06-22 DIAGNOSIS — E860 Dehydration: Secondary | ICD-10-CM | POA: Diagnosis not present

## 2015-06-22 DIAGNOSIS — K2970 Gastritis, unspecified, without bleeding: Secondary | ICD-10-CM | POA: Diagnosis present

## 2015-06-22 DIAGNOSIS — Z9049 Acquired absence of other specified parts of digestive tract: Secondary | ICD-10-CM | POA: Insufficient documentation

## 2015-06-22 DIAGNOSIS — R112 Nausea with vomiting, unspecified: Secondary | ICD-10-CM | POA: Diagnosis not present

## 2015-06-22 DIAGNOSIS — M549 Dorsalgia, unspecified: Secondary | ICD-10-CM | POA: Diagnosis not present

## 2015-06-22 DIAGNOSIS — E871 Hypo-osmolality and hyponatremia: Secondary | ICD-10-CM | POA: Diagnosis not present

## 2015-06-22 DIAGNOSIS — Z803 Family history of malignant neoplasm of breast: Secondary | ICD-10-CM | POA: Diagnosis not present

## 2015-06-22 DIAGNOSIS — Z87892 Personal history of anaphylaxis: Secondary | ICD-10-CM | POA: Insufficient documentation

## 2015-06-22 DIAGNOSIS — N189 Chronic kidney disease, unspecified: Secondary | ICD-10-CM | POA: Diagnosis not present

## 2015-06-22 DIAGNOSIS — Z79891 Long term (current) use of opiate analgesic: Secondary | ICD-10-CM | POA: Insufficient documentation

## 2015-06-22 DIAGNOSIS — Z9889 Other specified postprocedural states: Secondary | ICD-10-CM | POA: Insufficient documentation

## 2015-06-22 DIAGNOSIS — E039 Hypothyroidism, unspecified: Secondary | ICD-10-CM | POA: Diagnosis not present

## 2015-06-22 DIAGNOSIS — F419 Anxiety disorder, unspecified: Secondary | ICD-10-CM | POA: Insufficient documentation

## 2015-06-22 DIAGNOSIS — E785 Hyperlipidemia, unspecified: Secondary | ICD-10-CM | POA: Diagnosis not present

## 2015-06-22 DIAGNOSIS — Z79899 Other long term (current) drug therapy: Secondary | ICD-10-CM | POA: Insufficient documentation

## 2015-06-22 DIAGNOSIS — G8929 Other chronic pain: Secondary | ICD-10-CM | POA: Insufficient documentation

## 2015-06-22 DIAGNOSIS — N179 Acute kidney failure, unspecified: Secondary | ICD-10-CM | POA: Diagnosis not present

## 2015-06-22 DIAGNOSIS — R1110 Vomiting, unspecified: Secondary | ICD-10-CM

## 2015-06-22 LAB — CBC
HCT: 35.9 % — ABNORMAL LOW (ref 40.0–52.0)
Hemoglobin: 12.1 g/dL — ABNORMAL LOW (ref 13.0–18.0)
MCH: 30.4 pg (ref 26.0–34.0)
MCHC: 33.6 g/dL (ref 32.0–36.0)
MCV: 90.3 fL (ref 80.0–100.0)
Platelets: 249 10*3/uL (ref 150–440)
RBC: 3.97 MIL/uL — ABNORMAL LOW (ref 4.40–5.90)
RDW: 13.7 % (ref 11.5–14.5)
WBC: 13 10*3/uL — ABNORMAL HIGH (ref 3.8–10.6)

## 2015-06-22 LAB — URINALYSIS COMPLETE WITH MICROSCOPIC (ARMC ONLY)
Bacteria, UA: NONE SEEN
Bilirubin Urine: NEGATIVE
GLUCOSE, UA: NEGATIVE mg/dL
KETONES UR: NEGATIVE mg/dL
Leukocytes, UA: NEGATIVE
NITRITE: NEGATIVE
Protein, ur: NEGATIVE mg/dL
SPECIFIC GRAVITY, URINE: 1.019 (ref 1.005–1.030)
Squamous Epithelial / LPF: NONE SEEN
pH: 5 (ref 5.0–8.0)

## 2015-06-22 LAB — HEMOGLOBIN A1C: Hgb A1c MFr Bld: 5.5 % (ref 4.0–6.0)

## 2015-06-22 LAB — COMPREHENSIVE METABOLIC PANEL
ALK PHOS: 48 U/L (ref 38–126)
ALT: 22 U/L (ref 17–63)
ANION GAP: 7 (ref 5–15)
AST: 27 U/L (ref 15–41)
Albumin: 3.5 g/dL (ref 3.5–5.0)
BILIRUBIN TOTAL: 0.4 mg/dL (ref 0.3–1.2)
BUN: 28 mg/dL — ABNORMAL HIGH (ref 6–20)
CALCIUM: 8.6 mg/dL — AB (ref 8.9–10.3)
CO2: 28 mmol/L (ref 22–32)
CREATININE: 1.29 mg/dL — AB (ref 0.61–1.24)
Chloride: 107 mmol/L (ref 101–111)
GFR calc non Af Amer: >60 mL/min (ref >60)
Glucose, Bld: 125 mg/dL — ABNORMAL HIGH (ref 65–99)
Potassium: 3.8 mmol/L (ref 3.5–5.1)
Sodium: 142 mmol/L (ref 135–145)
TOTAL PROTEIN: 6.5 g/dL (ref 6.5–8.1)

## 2015-06-22 LAB — TSH: TSH: 3.386 u[IU]/mL (ref 0.350–4.500)

## 2015-06-22 LAB — LIPASE, BLOOD: Lipase: 36 U/L (ref 11–51)

## 2015-06-22 MED ORDER — SODIUM CHLORIDE 0.9 % IV SOLN
INTRAVENOUS | Status: DC
  Administered 2015-06-22 – 2015-06-23 (×4): via INTRAVENOUS

## 2015-06-22 MED ORDER — ONDANSETRON HCL 4 MG/2ML IJ SOLN
4.0000 mg | INTRAMUSCULAR | Status: DC | PRN
  Administered 2015-06-22: 4 mg via INTRAVENOUS
  Filled 2015-06-22: qty 2

## 2015-06-22 MED ORDER — MORPHINE SULFATE (PF) 4 MG/ML IV SOLN
6.0000 mg | Freq: Once | INTRAVENOUS | Status: AC
  Administered 2015-06-22: 6 mg via INTRAVENOUS
  Filled 2015-06-22: qty 2

## 2015-06-22 MED ORDER — ROSUVASTATIN CALCIUM 20 MG PO TABS
40.0000 mg | ORAL_TABLET | Freq: Every day | ORAL | Status: DC
  Administered 2015-06-23: 40 mg via ORAL
  Filled 2015-06-22: qty 2

## 2015-06-22 MED ORDER — ALPRAZOLAM 1 MG PO TABS
1.0000 mg | ORAL_TABLET | Freq: Every day | ORAL | Status: DC
  Administered 2015-06-22: 1 mg via ORAL
  Filled 2015-06-22: qty 1

## 2015-06-22 MED ORDER — ALPRAZOLAM 0.25 MG PO TABS
0.2500 mg | ORAL_TABLET | Freq: Four times a day (QID) | ORAL | Status: DC | PRN
  Administered 2015-06-22: 0.25 mg via ORAL
  Filled 2015-06-22 (×2): qty 1

## 2015-06-22 MED ORDER — ONDANSETRON HCL 4 MG/2ML IJ SOLN
4.0000 mg | Freq: Once | INTRAMUSCULAR | Status: AC | PRN
  Administered 2015-06-22: 4 mg via INTRAVENOUS
  Filled 2015-06-22: qty 2

## 2015-06-22 MED ORDER — GI COCKTAIL ~~LOC~~
30.0000 mL | Freq: Once | ORAL | Status: AC
  Administered 2015-06-22: 30 mL via ORAL
  Filled 2015-06-22: qty 30

## 2015-06-22 MED ORDER — ONDANSETRON HCL 4 MG/2ML IJ SOLN
4.0000 mg | Freq: Once | INTRAMUSCULAR | Status: AC
  Administered 2015-06-22: 4 mg via INTRAVENOUS
  Filled 2015-06-22: qty 2

## 2015-06-22 MED ORDER — DOXYCYCLINE HYCLATE 100 MG PO TABS
100.0000 mg | ORAL_TABLET | Freq: Two times a day (BID) | ORAL | Status: DC
  Administered 2015-06-22 – 2015-06-23 (×3): 100 mg via ORAL
  Filled 2015-06-22 (×3): qty 1

## 2015-06-22 MED ORDER — PANTOPRAZOLE SODIUM 40 MG PO TBEC
40.0000 mg | DELAYED_RELEASE_TABLET | Freq: Two times a day (BID) | ORAL | Status: DC
Start: 2015-06-22 — End: 2015-06-23
  Administered 2015-06-22 – 2015-06-23 (×3): 40 mg via ORAL
  Filled 2015-06-22 (×3): qty 1

## 2015-06-22 MED ORDER — SUCRALFATE 1 GM/10ML PO SUSP
1.0000 g | Freq: Three times a day (TID) | ORAL | Status: DC
  Administered 2015-06-22 – 2015-06-23 (×3): 1 g via ORAL
  Filled 2015-06-22 (×3): qty 10

## 2015-06-22 MED ORDER — PROCHLORPERAZINE EDISYLATE 5 MG/ML IJ SOLN: 10.0000 mg | INTRAMUSCULAR | Status: DC | PRN

## 2015-06-22 MED ORDER — LORAZEPAM 2 MG/ML IJ SOLN
1.0000 mg | INTRAMUSCULAR | Status: AC
  Administered 2015-06-22: 1 mg via INTRAVENOUS
  Filled 2015-06-22: qty 1

## 2015-06-22 MED ORDER — PROMETHAZINE HCL 25 MG PO TABS
12.5000 mg | ORAL_TABLET | Freq: Four times a day (QID) | ORAL | Status: DC | PRN
  Administered 2015-06-22: 12.5 mg via ORAL
  Filled 2015-06-22: qty 1

## 2015-06-22 MED ORDER — SODIUM CHLORIDE 0.9 % IV BOLUS (SEPSIS)
1000.0000 mL | Freq: Once | INTRAVENOUS | Status: AC
  Administered 2015-06-22: 1000 mL via INTRAVENOUS

## 2015-06-22 MED ORDER — LISINOPRIL 20 MG PO TABS
40.0000 mg | ORAL_TABLET | Freq: Every day | ORAL | Status: DC
  Administered 2015-06-22 – 2015-06-23 (×2): 40 mg via ORAL
  Filled 2015-06-22 (×2): qty 2

## 2015-06-22 MED ORDER — LEVOTHYROXINE SODIUM 50 MCG PO TABS
50.0000 ug | ORAL_TABLET | Freq: Every day | ORAL | Status: DC
  Administered 2015-06-23: 50 ug via ORAL
  Filled 2015-06-22 (×2): qty 1

## 2015-06-22 MED ORDER — METHADONE HCL 10 MG PO TABS
10.0000 mg | ORAL_TABLET | Freq: Three times a day (TID) | ORAL | Status: DC
  Administered 2015-06-22 – 2015-06-23 (×4): 10 mg via ORAL
  Filled 2015-06-22 (×4): qty 1

## 2015-06-22 MED ORDER — METOPROLOL SUCCINATE ER 50 MG PO TB24
50.0000 mg | ORAL_TABLET | Freq: Every day | ORAL | Status: DC
  Administered 2015-06-23: 50 mg via ORAL
  Filled 2015-06-22: qty 1

## 2015-06-22 MED ORDER — ACETAMINOPHEN 325 MG PO TABS
650.0000 mg | ORAL_TABLET | Freq: Four times a day (QID) | ORAL | Status: DC | PRN
  Administered 2015-06-22: 650 mg via ORAL
  Filled 2015-06-22: qty 2

## 2015-06-22 MED ORDER — MORPHINE SULFATE (PF) 2 MG/ML IV SOLN
2.0000 mg | INTRAVENOUS | Status: DC | PRN
  Administered 2015-06-22: 2 mg via INTRAVENOUS
  Filled 2015-06-22: qty 1

## 2015-06-22 MED ORDER — ACETAMINOPHEN 650 MG RE SUPP: 650.0000 mg | Freq: Four times a day (QID) | RECTAL | Status: DC | PRN

## 2015-06-22 MED ORDER — PROMETHAZINE HCL 25 MG PO TABS
ORAL_TABLET | ORAL | Status: AC
  Filled 2015-06-22: qty 1

## 2015-06-22 MED ORDER — ENOXAPARIN SODIUM 40 MG/0.4ML ~~LOC~~ SOLN
40.0000 mg | SUBCUTANEOUS | Status: DC
  Administered 2015-06-22 – 2015-06-23 (×2): 40 mg via SUBCUTANEOUS
  Filled 2015-06-22 (×2): qty 0.4

## 2015-06-22 MED ORDER — METOCLOPRAMIDE HCL 5 MG/ML IJ SOLN
10.0000 mg | Freq: Once | INTRAMUSCULAR | Status: AC
  Administered 2015-06-22: 10 mg via INTRAVENOUS
  Filled 2015-06-22: qty 2

## 2015-06-22 MED ORDER — DOCUSATE SODIUM 100 MG PO CAPS
100.0000 mg | ORAL_CAPSULE | Freq: Two times a day (BID) | ORAL | Status: DC
  Administered 2015-06-22 – 2015-06-23 (×2): 100 mg via ORAL
  Filled 2015-06-22 (×2): qty 1

## 2015-06-22 NOTE — ED Notes (Signed)
Pt given ice chips and water about half hour ago.  Pt states able to eat a few ice chips but does not feel able to drink water.

## 2015-06-22 NOTE — ED Provider Notes (Signed)
[facility] Emergency Department Provider Note  ____________________________________________  Time seen: Approximately 2:57 AM  I have reviewed the triage vital signs and the nursing notes.   HISTORY  Chief Complaint Abdominal Pain    HPI Kevin Schmitt is a 45 y.o. male presents for evaluation of "gastritis".  Patient reports that he has a history of gastritis, and that at times it flares up causing him severe nausea and vomiting. Reports that Dr. Mechele Collin as told him that he has back gastritis and has done several biopsies of his stomach in the past. He was seen here a few days ago and reports continued to have vomiting at home and nausea.  Patient reports he is unable to keep his methadone, oxycodone, and Xanax down due to vomiting. He reports he is under the care of a pain specialist. He denies fevers chills but reports nausea and infrequent vomiting. No diarrhea. States pain in the upper abdomen especially during episodes of vomiting but no persistent lower abdominal pain or discomfort.   Past Medical History  Diagnosis Date  . Gastritis   . Wrist fracture right  . Hypertension   . Thyroid disease   . Anxiety     There are no active problems to display for this patient.   Past Surgical History  Procedure Laterality Date  . Appendectomy    . Finger fracture surgery      Current Outpatient Rx  Name  Route  Sig  Dispense  Refill  . ALPRAZolam (XANAX) 1 MG tablet   Oral   Take 1 mg by mouth at bedtime.           Dispense as written.   Marland Kitchen levothyroxine (SYNTHROID, LEVOTHROID) 50 MCG tablet   Oral   Take 50 mcg by mouth daily before breakfast.           Dispense as written.   Marland Kitchen lisinopril (PRINIVIL,ZESTRIL) 40 MG tablet   Oral   Take 40 mg by mouth daily.           Dispense as written.   . methadone (DOLOPHINE) 10 MG tablet   Oral   Take 10 mg by mouth every 8 (eight) hours.           Dispense as written.   .  metoprolol succinate (TOPROL-XL) 50 MG 24 hr tablet   Oral   Take 50 mg by mouth daily. Take with or immediately following a meal.           Dispense as written.   Marland Kitchen oxycodone (ROXICODONE) 30 MG immediate release tablet   Oral   Take 30 mg by mouth every 4 (four) hours as needed for pain.           Dispense as written.   . pantoprazole (PROTONIX) 40 MG tablet   Oral   Take 40 mg by mouth 2 (two) times daily.           Dispense as written.   . rosuvastatin (CRESTOR) 40 MG tablet   Oral   Take 40 mg by mouth daily.           Dispense as written.     Allergies Penicillins  History reviewed. No pertinent family history.  Social History Social History  Substance Use Topics  . Smoking status: Never Smoker   . Smokeless tobacco: None  . Alcohol Use: No    Review of Systems Constitutional: No fever/chills Eyes: No visual changes. ENT: No sore throat. Cardiovascular: Denies chest  pain. Respiratory: Denies shortness of breath. Gastrointestinal:  No diarrhea.  No constipation. Genitourinary: Negative for dysuria. Musculoskeletal: Negative for back painExcept for his chronic back pain which he is treated for, no changes. Skin: Negative for rash. Neurological: Negative for headaches, focal weakness or numbness.  10-point ROS otherwise negative.  ____________________________________________   PHYSICAL EXAM:  VITAL SIGNS: ED Triage Vitals  Enc Vitals Group     BP 06/22/15 0206 154/96 mmHg     Pulse Rate 06/22/15 0206 62     Resp 06/22/15 0206 16     Temp 06/22/15 0206 99.4 F (37.4 C)     Temp Source 06/22/15 0206 Oral     SpO2 06/22/15 0201 98 %     Weight 06/22/15 0206 200 lb (90.719 kg)     Height 06/22/15 0206 5\' 10"  (1.778 m)     Head Cir --      Peak Flow --      Pain Score 06/22/15 0207 8     Pain Loc --      Pain Edu? --      Excl. in GC? --    Constitutional: Alert and oriented. Well appearing and in no acute distress.He is sitting at the  side of the bed, holding onto an emesis basin with a small amount of clear phlegm noted within it. Eyes: Conjunctivae are normal. PERRL. EOMI. Head: Atraumatic. Nose: No congestion/rhinnorhea. Mouth/Throat: Mucous membranes are moist.  Oropharynx non-erythematous. Neck: No stridor.   Cardiovascular: Normal rate, regular rhythm. Grossly normal heart sounds.  Good peripheral circulation. Respiratory: Normal respiratory effort.  No retractions. Lungs CTAB. Gastrointestinal: Soft and nontender except reports moderate tenderness in the epigastrium and left upper abdomen with no rebound or guarding. No distention. No abdominal bruits. No CVA tenderness. Musculoskeletal: No lower extremity tenderness nor edema.  Neurologic:  Normal speech and language. No gross focal neurologic deficits are appreciated. No gait instability. Skin:  Skin is warm, dry and intact. No rash noted. Psychiatric: Mood and affect are normal. Speech and behavior are normal.  ____________________________________________   LABS (all labs ordered are listed, but only abnormal results are displayed)  Labs Reviewed  COMPREHENSIVE METABOLIC PANEL - Abnormal; Notable for the following:    Glucose, Bld 125 (*)    BUN 28 (*)    Creatinine, Ser 1.29 (*)    Calcium 8.6 (*)    All other components within normal limits  CBC - Abnormal; Notable for the following:    WBC 13.0 (*)    RBC 3.97 (*)    Hemoglobin 12.1 (*)    HCT 35.9 (*)    All other components within normal limits  URINALYSIS COMPLETEWITH MICROSCOPIC ([facility] ONLY) - Abnormal; Notable for the following:    Color, Urine YELLOW (*)    APPearance CLEAR (*)    Hgb urine dipstick 1+ (*)    All other components within normal limits  LIPASE, BLOOD   ____________________________________________  EKG   ____________________________________________  RADIOLOGY  Patient had CT scan abdomen and pelvis performed just recently, reviewed and was without acute  finding. ____________________________________________   PROCEDURES  Procedure(s) performed: None  Critical Care performed: No  ____________________________________________   INITIAL IMPRESSION / ASSESSMENT AND PLAN / ED COURSE  Pertinent labs & imaging results that were available during my care of the patient were reviewed by me and considered in my medical decision making (see chart for details).  Patient transfer ongoing nausea, vomiting and "gastritis". Patient's clinical history and examination presently quite  reassuring. He had recent thorough evaluation in the ER including CT scan. At the present time I find no evidence of acute peritonitis or need for repeat CT imaging as he just had CT a few days ago and reports recurrence of symptoms consistent with gastritis which she has had multiple times. There is no evidence of acute infectious etiology, his white blood cell count is down trending, and he is awake alert nontoxic appearing.  ----------------------------------------- 3:59 AM on 06/22/2015 -----------------------------------------  Patient able to tolerate by mouth fluids at this time. He is awake alert in no distress, labs very reassuring. Discussed with him and we will discharge him to home with abdominal pain and strict return precautions.  Return precautions and treatment recommendations and follow-up discussed with the patient who is agreeable with the plan.  ----------------------------------------- 4:45 AM on 06/22/2015 -----------------------------------------  Patient reports he threw up once when he is given urine specimen. He does demonstrate a small amount of clear fluid and emesis basin but no evidence of gastric contents or obvious large volume emesis. We'll give additional Reglan and reevaluate. Awake alert no distress.  ----------------------------------------- 6:12 AM on 06/22/2015 -----------------------------------------  The patient notes  intractable nausea and vomiting. He continues to spit up, was unable tolerate water but did tolerate some ice chips. I discussed with the patient admission versus discharge with antiemetics, and the patient indicates that he would like to be admitted at this time as he does not believe he would be able to keep any fluids down and would get dehydrated home. We have tried multiple antibiotics including 3 different agents including Ativan, Zofran and Reglan and he continues to have ongoing symptoms though his pain has improved. Discussed with Dr. Sheryle Hailiamond the patient only admitted to the hospital service for ongoing evaluation. ____________________________________________   FINAL CLINICAL IMPRESSION(S) / ED DIAGNOSES  Final diagnoses:  Intractable vomiting with nausea, vomiting of unspecified type  Gastritis      Sharyn CreamerMark Kharisma Glasner, MD 06/22/15 [id number]

## 2015-06-22 NOTE — Progress Notes (Signed)
[facility] [place] [facility]   PATIENT NAME: Kevin FannyShannon Schmitt    MR#:  [id number]  DATE OF BIRTH:  1970/04/25  SUBJECTIVE:  CHIEF COMPLAINT:   Chief Complaint  Patient presents with  . Abdominal Pain  The patient is 45 year old Caucasian male with past history significant for history of cyclic nausea and vomiting and abdominal pain who presents to hospital with complaints of another episode of abdominal pain, nausea and vomiting. Apparently patient was in the emergency room 5 days ago and discharged, however, presented back since he was not able to keep anything down again with nausea and vomiting. On arrival to the hospital. He was complaining of abdominal pain and upper abdomen. He tells me that he has been having those pains as well as intermittent nausea, vomiting every 3 months or more frequently. CT scan of abdomen and pelvis done on 06/17/2015 was unremarkable  Review of Systems  Constitutional: Negative for fever, chills and weight loss.  HENT: Negative for congestion.   Eyes: Negative for blurred vision and double vision.  Respiratory: Negative for cough, sputum production, shortness of breath and wheezing.   Cardiovascular: Negative for chest pain, palpitations, orthopnea, leg swelling and PND.  Gastrointestinal: Positive for nausea, vomiting and abdominal pain. Negative for diarrhea, constipation and blood in stool.  Genitourinary: Negative for dysuria, urgency, frequency and hematuria.  Musculoskeletal: Negative for falls.  Neurological: Negative for dizziness, tremors, focal weakness and headaches.  Endo/Heme/Allergies: Does not bruise/bleed easily.  Psychiatric/Behavioral: Negative for depression. The patient does not have insomnia.     VITAL SIGNS: Blood pressure 118/64, pulse 63, temperature 98.9 F (37.2 C), temperature source Oral, resp. rate 16, height 5\' 10"  (1.778 m), weight 90.719 kg (200 lb), SpO2 100 %.  PHYSICAL EXAMINATION:    GENERAL:  45 y.o.-year-old patient lying in the bed with no acute distress.  EYES: Pupils equal, round, reactive to light and accommodation. No scleral icterus. Extraocular muscles intact.  HEENT: Head atraumatic, normocephalic. Oropharynx and nasopharynx clear.  NECK:  Supple, no jugular venous distention. No thyroid enlargement, no tenderness.  LUNGS: Normal breath sounds bilaterally, no wheezing, rales,rhonchi or crepitation. No use of accessory muscles of respiration.  CARDIOVASCULAR: S1, S2 normal. No murmurs, rubs, or gallops.  ABDOMEN: Soft, nontender, nondistended. Bowel sounds present. No organomegaly or mass.  EXTREMITIES: No pedal edema, cyanosis, or clubbing.  NEUROLOGIC: Cranial nerves II through XII are intact. Muscle strength 5/5 in all extremities. Sensation intact. Gait not checked.  PSYCHIATRIC: The patient is alert and oriented x 3.  SKIN: No obvious rash, lesion, or ulcer.   ORDERS/RESULTS REVIEWED:   CBC  Recent Labs Lab 06/17/15 0518 06/17/15 1247 06/22/15 0216  WBC 23.2* 16.1* 13.0*  HGB 13.9 13.6 12.1*  HCT 41.2 40.2 35.9*  PLT 213 179 249  MCV 90.2 89.5 90.3  MCH 30.5 30.2 30.4  MCHC 33.8 33.7 33.6  RDW 13.1 13.2 13.7  LYMPHSABS  --  2.1  --   MONOABS  --  1.0  --   EOSABS  --  0.0  --   BASOSABS  --  0.1  --    ------------------------------------------------------------------------------------------------------------------  Chemistries   Recent Labs Lab 06/17/15 0518 06/22/15 0216  NA 131* 142  K 3.6 3.8  CL 93* 107  CO2 23 28  GLUCOSE 116* 125*  BUN 26* 28*  CREATININE 1.38* 1.29*  CALCIUM 9.0 8.6*  AST 37 27  ALT 28 22  ALKPHOS 70 48  BILITOT 2.6* 0.4   ------------------------------------------------------------------------------------------------------------------  estimated creatinine clearance is 81.1 mL/min (by C-G formula based on Cr of  1.29). ------------------------------------------------------------------------------------------------------------------  Recent Labs  06/22/15 0216  TSH 3.386    Cardiac Enzymes No results for input(s): CKMB, TROPONINI, MYOGLOBIN in the last 168 hours.  Invalid input(s): CK ------------------------------------------------------------------------------------------------------------------ Invalid input(s): POCBNP ---------------------------------------------------------------------------------------------------------------  RADIOLOGY: No results found.  EKG:  Orders placed or performed in visit on 01/20/13  . EKG 12-Lead    ASSESSMENT AND PLAN:  Active Problems:   Intractable nausea and vomiting  #1. Cyclic nausea and vomiting, continue IV fluids, Zofran and Compazine intermittently, initiate patient on clear liquid diet to be advanced slowly as tolerated. Adding Carafate. Patient was seen by gastroenterologist in the past and had a couple EGDs as well as colonoscopies with no significant abnormalities, he was investigated in the past for gallbladder disease which was unremarkable/negative #2. Anxiety, continue Xanax, follow clinically #3. Acute renal insufficiency, improving with IV fluids #4. Hyponatremia, likely dehydration related, resolved #5. Leukocytosis of unclear etiology, likely stress, follow clinically, not on antibiotic therapy. CT scan of abdomen and pelvis was normal   Management plans discussed with the patient, family and they are in agreement.   DRUG ALLERGIES:  Allergies  Allergen Reactions  . Penicillins Anaphylaxis and Hives    Has patient had a PCN reaction causing immediate rash, facial/tongue/throat swelling, SOB or lightheadedness with hypotension: Yes Has patient had a PCN reaction causing severe rash involving mucus membranes or skin necrosis: No Has patient had a PCN reaction that required hospitalization No Has patient had a PCN reaction  occurring within the last 10 years: No If all of the above answers are "NO", then may proceed with Cephalosporin use.    CODE STATUS:     Code Status Orders        Start     Ordered   06/22/15 0756  Full code   Continuous     06/22/15 0755    Code Status History    Date Active Date Inactive Code Status Order ID Comments User Context   This patient has a current code status but no historical code status.    Advance Directive Documentation        Most Recent Value   Type of Advance Directive  Living will   Pre-existing out of facility DNR order (yellow form or pink MOST form)     "MOST" Form in Place?        TOTAL TIME TAKING CARE OF THIS PATIENT: 40 minutes.  Discussed with patient as well as his mother  Katharina Caper M.D on 06/22/2015 at 4:08 PM  Between 7am to 6pm - Pager - [phone]  After 6pm go to [email or web address] - password [email or web address] [facility]  [place]  [facility]  Office  [phone]  CC: Primary care physician; PROVIDER NOT IN SYSTEM

## 2015-06-22 NOTE — ED Notes (Signed)
Pt presents to ED via EMS c/o abdominal pain. Per EMS pt was admitetd Sunday of this week for abdominal pain, nausea, and vomiting; and came back today because he has not gotten any better. Pt alert and oriented, in no acute distress at this time.

## 2015-06-22 NOTE — H&P (Signed)
Kevin Schmitt is an 45 y.o. male.   Chief Complaint: Nausea and vomiting HPI: The patient with past medical history is negative for gastritis presents emergency department complaining of nausea and vomiting. He was seen in emergency department 5 days ago with the same complaints and discharged after successful oral intake challenge. He states that he was gradually trying to advance his diet at home but was unable to eat anything for the last 3 days. In the emergency department the patient received multiple doses of anti-medic but was unable to keep anything down. Laboratory evaluation revealed mild leukocytosis as well as acute on chronic kidney injury which prompted the emergency department staff to call for admission.  Past Medical History  Diagnosis Date  . Gastritis   . Wrist fracture right  . Hypertension   . Thyroid disease   . Anxiety     Past Surgical History  Procedure Laterality Date  . Appendectomy    . Finger fracture surgery      Family History  Problem Relation Age of Onset  . Breast cancer Maternal Grandmother    Social History:  reports that he has never smoked. He does not have any smokeless tobacco history on file. He reports that he uses illicit drugs (Marijuana). He reports that he does not drink alcohol.  Allergies:  Allergies  Allergen Reactions  . Penicillins Anaphylaxis and Hives    Has patient had a PCN reaction causing immediate rash, facial/tongue/throat swelling, SOB or lightheadedness with hypotension: Yes Has patient had a PCN reaction causing severe rash involving mucus membranes or skin necrosis: No Has patient had a PCN reaction that required hospitalization No Has patient had a PCN reaction occurring within the last 10 years: No If all of the above answers are "NO", then may proceed with Cephalosporin use.    Prior to Admission medications   Medication Sig Start Date End Date Taking? Authorizing Provider  ALPRAZolam Duanne Moron) 1 MG tablet Take  1 mg by mouth at bedtime.   Yes Historical Provider, MD  levothyroxine (SYNTHROID, LEVOTHROID) 50 MCG tablet Take 50 mcg by mouth daily before breakfast.   Yes Historical Provider, MD  lisinopril (PRINIVIL,ZESTRIL) 40 MG tablet Take 40 mg by mouth daily.   Yes Historical Provider, MD  methadone (DOLOPHINE) 10 MG tablet Take 10 mg by mouth every 8 (eight) hours.   Yes Historical Provider, MD  metoprolol succinate (TOPROL-XL) 50 MG 24 hr tablet Take 50 mg by mouth daily. Take with or immediately following a meal.   Yes Historical Provider, MD  oxycodone (ROXICODONE) 30 MG immediate release tablet Take 30 mg by mouth every 4 (four) hours as needed for pain.   Yes Historical Provider, MD  pantoprazole (PROTONIX) 40 MG tablet Take 40 mg by mouth 2 (two) times daily.   Yes Historical Provider, MD  rosuvastatin (CRESTOR) 40 MG tablet Take 40 mg by mouth daily.   Yes Historical Provider, MD     Results for orders placed or performed during the hospital encounter of 06/22/15 (from the past 48 hour(s))  Lipase, blood     Status: None   Collection Time: 06/22/15  2:16 AM  Result Value Ref Range   Lipase 36 11 - 51 U/L  Comprehensive metabolic panel     Status: Abnormal   Collection Time: 06/22/15  2:16 AM  Result Value Ref Range   Sodium 142 135 - 145 mmol/L   Potassium 3.8 3.5 - 5.1 mmol/L   Chloride 107 101 - 111  mmol/L   CO2 28 22 - 32 mmol/L   Glucose, Bld 125 (H) 65 - 99 mg/dL   BUN 28 (H) 6 - 20 mg/dL   Creatinine, Ser 1.29 (H) 0.61 - 1.24 mg/dL   Calcium 8.6 (L) 8.9 - 10.3 mg/dL   Total Protein 6.5 6.5 - 8.1 g/dL   Albumin 3.5 3.5 - 5.0 g/dL   AST 27 15 - 41 U/L   ALT 22 17 - 63 U/L   Alkaline Phosphatase 48 38 - 126 U/L   Total Bilirubin 0.4 0.3 - 1.2 mg/dL   GFR calc non Af Amer >60 >60 mL/min   GFR calc Af Amer >60 >60 mL/min    Comment: (NOTE) The eGFR has been calculated using the CKD EPI equation. This calculation has not been validated in all clinical situations. eGFR's  persistently <60 mL/min signify possible Chronic Kidney Disease.    Anion gap 7 5 - 15  CBC     Status: Abnormal   Collection Time: 06/22/15  2:16 AM  Result Value Ref Range   WBC 13.0 (H) 3.8 - 10.6 K/uL   RBC 3.97 (L) 4.40 - 5.90 MIL/uL   Hemoglobin 12.1 (L) 13.0 - 18.0 g/dL   HCT 35.9 (L) 40.0 - 52.0 %   MCV 90.3 80.0 - 100.0 fL   MCH 30.4 26.0 - 34.0 pg   MCHC 33.6 32.0 - 36.0 g/dL   RDW 13.7 11.5 - 14.5 %   Platelets 249 150 - 440 K/uL  Urinalysis complete, with microscopic     Status: Abnormal   Collection Time: 06/22/15  4:37 AM  Result Value Ref Range   Color, Urine YELLOW (A) YELLOW   APPearance CLEAR (A) CLEAR   Glucose, UA NEGATIVE NEGATIVE mg/dL   Bilirubin Urine NEGATIVE NEGATIVE   Ketones, ur NEGATIVE NEGATIVE mg/dL   Specific Gravity, Urine 1.019 1.005 - 1.030   Hgb urine dipstick 1+ (A) NEGATIVE   pH 5.0 5.0 - 8.0   Protein, ur NEGATIVE NEGATIVE mg/dL   Nitrite NEGATIVE NEGATIVE   Leukocytes, UA NEGATIVE NEGATIVE   RBC / HPF 0-5 0 - 5 RBC/hpf   WBC, UA 0-5 0 - 5 WBC/hpf   Bacteria, UA NONE SEEN NONE SEEN   Squamous Epithelial / LPF NONE SEEN NONE SEEN   Mucous PRESENT    Hyaline Casts, UA PRESENT    No results found.  Review of Systems  Constitutional: Negative for fever and chills.  HENT: Negative for sore throat and tinnitus.   Eyes: Negative for blurred vision and redness.  Respiratory: Negative for cough and shortness of breath.   Cardiovascular: Negative for chest pain, palpitations, orthopnea and PND.  Gastrointestinal: Positive for nausea, vomiting and abdominal pain. Negative for diarrhea.  Genitourinary: Negative for dysuria, urgency and frequency.  Musculoskeletal: Positive for back pain (acute on chronic). Negative for myalgias and joint pain.  Skin: Negative for rash.       No lesions  Neurological: Negative for speech change, focal weakness and weakness.  Endo/Heme/Allergies: Does not bruise/bleed easily.       No temperature  intolerance  Psychiatric/Behavioral: Negative for depression and suicidal ideas.    Blood pressure 160/66, pulse 58, temperature 99.4 F (37.4 C), temperature source Oral, resp. rate 20, height 5' 10"  (1.778 m), weight 90.719 kg (200 lb), SpO2 100 %. Physical Exam  Nursing note and vitals reviewed. Constitutional: He is oriented to person, place, and time. He appears well-developed and well-nourished. No distress.  HENT:  Head: Normocephalic and atraumatic.  Mouth/Throat: Oropharynx is clear and moist.  Eyes: Conjunctivae and EOM are normal. Pupils are equal, round, and reactive to light. No scleral icterus.  Neck: Normal range of motion. Neck supple. No JVD present. No tracheal deviation present. No thyromegaly present.  Cardiovascular: Normal rate and regular rhythm.  Exam reveals no gallop and no friction rub.   No murmur heard. Respiratory: Effort normal. No respiratory distress. He has wheezes.  GI: Soft. Bowel sounds are normal. He exhibits no distension. There is no tenderness.  Genitourinary:  Deferred  Musculoskeletal: Normal range of motion. He exhibits no edema.  Lymphadenopathy:    He has no cervical adenopathy.  Neurological: He is alert and oriented to person, place, and time. No cranial nerve deficit.  Skin: Skin is warm and dry. No rash noted. No erythema.  Psychiatric: He has a normal mood and affect. His behavior is normal. Judgment and thought content normal.     Assessment/Plan This is a 45 year old male admitted for intractable nausea and vomiting. 1. Nausea and vomiting: Intractable. We will continue to give the patient antiemetics and hydrate with intravenous fluid. I place him on a clear liquid diet for now that we may advance as the patient is able to tolerate. The patient denies diarrhea but he does have a history of gastritis. Low suspicion for infectious etiology. Continue PPI. Gastroenterology consult at discretion of the primary team. 2. Acute on chronic  kidney disease: Secondary to dehydration from recurrent vomiting. Hydrate with intravenous fluid and encourage by mouth intake. Avoid nephrotoxic agents 3. Essential hypertension: Uncontrolled (likely secondary to recurrent vomiting); continue metoprolol and lisinopril 4. Back pain: Chronic. The patient has been compliant on methadone prescribed through pain clinic. May continue long-acting analgesic, although this may contribute to some mild gastroparesis resulting in the patient's current symptoms. 5. Hypothyroidism: Check TSH. Continue Synthroid 6. Hyperlipidemia: Continue statin therapy 7. Anxiety: Intravenous factor to recurrent nausea and vomiting; continue alprazolam 8. DVT prophylaxis: Lovenox 9. GI prophylaxis: As above The patient is a full code. Time spent on admission orders and patient care approximately 45 minutes   Harrie Foreman, MD 06/22/2015, 6:48 AM

## 2015-06-22 NOTE — Care Management Obs Status (Signed)
MEDICARE OBSERVATION STATUS NOTIFICATION   Patient Details  Name: Kevin Schmitt MRN: [id number] Date of Birth: 26-Apr-1970   Medicare Observation Status Notification Given:  Yes    Eber HongGreene, Sayla Golonka R, RN 06/22/2015, 2:48 PM

## 2015-06-22 NOTE — ED Notes (Signed)
Pt states unable to give urine sample at this time 

## 2015-06-23 LAB — CBC
HEMATOCRIT: 33.9 % — AB (ref 40.0–52.0)
Hemoglobin: 11.4 g/dL — ABNORMAL LOW (ref 13.0–18.0)
MCH: 31 pg (ref 26.0–34.0)
MCHC: 33.6 g/dL (ref 32.0–36.0)
MCV: 92.4 fL (ref 80.0–100.0)
PLATELETS: 211 10*3/uL (ref 150–440)
RBC: 3.67 MIL/uL — ABNORMAL LOW (ref 4.40–5.90)
RDW: 13.6 % (ref 11.5–14.5)
WBC: 8.1 10*3/uL (ref 3.8–10.6)

## 2015-06-23 LAB — BASIC METABOLIC PANEL
Anion gap: 7 (ref 5–15)
BUN: 12 mg/dL (ref 6–20)
CHLORIDE: 109 mmol/L (ref 101–111)
CO2: 25 mmol/L (ref 22–32)
CREATININE: 0.83 mg/dL (ref 0.61–1.24)
Calcium: 8.3 mg/dL — ABNORMAL LOW (ref 8.9–10.3)
GFR calc Af Amer: >60 mL/min (ref >60)
GFR calc non Af Amer: >60 mL/min (ref >60)
GLUCOSE: 99 mg/dL (ref 65–99)
POTASSIUM: 3.4 mmol/L — AB (ref 3.5–5.1)
SODIUM: 141 mmol/L (ref 135–145)

## 2015-06-23 MED ORDER — SUCRALFATE 1 GM/10ML PO SUSP: 1.0000 g | Freq: Three times a day (TID) | ORAL | Status: DC

## 2015-06-23 NOTE — Progress Notes (Signed)
Patient discharged home with family.  All discharge instructions reviewed and discharge paperwork given to patient.  Patient verbalized understanding.  IV removed in tact. Prescription given to patient with all questions and concerns addressed. Patient's mother at bedside for transfer home.  

## 2015-06-23 NOTE — Discharge Summary (Signed)
[employer] [facility] [place] [facility]   PATIENT NAME: Kevin Schmitt    MR#:  [id number]  DATE OF BIRTH:  [birth year]  DATE OF ADMISSION:  06/22/2015 ADMITTING PHYSICIAN: Kevin Natal, MD  DATE OF DISCHARGE: 06/23/2015  2:07 PM  PRIMARY CARE PHYSICIAN: PROVIDER NOT IN SYSTEM    ADMISSION DIAGNOSIS:  Gastritis [K29.70] Intractable vomiting with nausea, vomiting of unspecified type [R11.10]  DISCHARGE DIAGNOSIS:  Active Problems:   Intractable nausea and vomiting   SECONDARY DIAGNOSIS:   Past Medical History  Diagnosis Date  . Gastritis   . Wrist fracture right  . Hypertension   . Thyroid disease   . Anxiety     HOSPITAL COURSE:   45 year old male with past medical history of chronic pain, gastritis, hypertension, hypothyroidism, anxiety who presented to the hospital due to intractable nausea vomiting abdominal pain.  1. Intractable nausea and vomiting-etiology unclear. Patient has had an extensive workup in the past with EGDs and colonoscopy with no significant abnormalities. Patient also had a CT scan of the abdomen pelvis done on 06/17/2015 showing no evidence of acute pathology. -Patient was treated supportively with IV fluids, antiemetics, pain control. He was also started on some Carafate which seemed to have improved his nausea. With supportive care his clinical symptoms have improved his diet has been advanced and is tolerating it well and therefore being discharged home on his home meds including Carafate.  2. Chronic pain-patient will continue his methadone and, oxycodone. -he follows up with the pain clinic in [place].  3. Anxiety - cont. Xanax.   4. HTN - he will cont. Metoprolol  5. Hyperlipidemia - he will cont. His Crestor.    DISCHARGE CONDITIONS:   Stable.   CONSULTS OBTAINED:     DRUG ALLERGIES:   Allergies  Allergen Reactions  . Penicillins Anaphylaxis and Hives    Has patient had a PCN reaction causing immediate  rash, facial/tongue/throat swelling, SOB or lightheadedness with hypotension: Yes Has patient had a PCN reaction causing severe rash involving mucus membranes or skin necrosis: No Has patient had a PCN reaction that required hospitalization No Has patient had a PCN reaction occurring within the last 10 years: No If all of the above answers are "NO", then may proceed with Cephalosporin use.    DISCHARGE MEDICATIONS:   Discharge Medication List as of 06/23/2015 11:28 AM    CONTINUE these medications which have NOT CHANGED   Details  ALPRAZolam (XANAX) 1 MG tablet Take 1 mg by mouth at bedtime., Until Discontinued, Historical Med    levothyroxine (SYNTHROID, LEVOTHROID) 50 MCG tablet Take 50 mcg by mouth daily before breakfast., Until Discontinued, Historical Med    lisinopril (PRINIVIL,ZESTRIL) 40 MG tablet Take 40 mg by mouth daily., Until Discontinued, Historical Med    methadone (DOLOPHINE) 10 MG tablet Take 10 mg by mouth every 8 (eight) hours., Until Discontinued, Historical Med    metoprolol succinate (TOPROL-XL) 50 MG 24 hr tablet Take 50 mg by mouth daily. Take with or immediately following a meal., Until Discontinued, Historical Med    oxycodone (ROXICODONE) 30 MG immediate release tablet Take 30 mg by mouth every 4 (four) hours as needed for pain., Until Discontinued, Historical Med    pantoprazole (PROTONIX) 40 MG tablet Take 40 mg by mouth 2 (two) times daily., Until Discontinued, Historical Med    rosuvastatin (CRESTOR) 40 MG tablet Take 40 mg by mouth daily., Until Discontinued, Historical Med  DISCHARGE INSTRUCTIONS:   DIET:  Cardiac diet  DISCHARGE CONDITION:  Stable  ACTIVITY:  Activity as tolerated  OXYGEN:  Home Oxygen: No.   Oxygen Delivery: room air  DISCHARGE LOCATION:  home   If you experience worsening of your admission symptoms, develop shortness of breath, life threatening emergency, suicidal or homicidal thoughts you must seek medical  attention immediately by calling 911 or calling your MD immediately  if symptoms less severe.  You Must read complete instructions/literature along with all the possible adverse reactions/side effects for all the Medicines you take and that have been prescribed to you. Take any new Medicines after you have completely understood and accpet all the possible adverse reactions/side effects.   Please note  You were cared for by a hospitalist during your hospital stay. If you have any questions about your discharge medications or the care you received while you were in the hospital after you are discharged, you can call the unit and asked to speak with the hospitalist on call if the hospitalist that took care of you is not available. Once you are discharged, your primary care physician will handle any further medical issues. Please note that NO REFILLS for any discharge medications will be authorized once you are discharged, as it is imperative that you return to your primary care physician (or establish a relationship with a primary care physician if you do not have one) for your aftercare needs so that they can reassess your need for medications and monitor your lab values.     Today   No further abdominal pain. NO N/V today.  Asking to have his Methadone dose corrected as he takes a higher dose.   VITAL SIGNS:  Blood pressure 156/98, pulse 112, temperature 97.5 F (36.4 C), temperature source Oral, resp. rate 17, height 5\' 11"  (1.803 m), weight 98.431 kg (217 lb), SpO2 100 %.  I/O:   Intake/Output Summary (Last 24 hours) at 06/23/15 1602 Last data filed at 06/23/15 0900  Gross per 24 hour  Intake 2491.7 ml  Output 933.61 ml  Net 1558.09 ml    PHYSICAL EXAMINATION:  GENERAL:  45 y.o.-year-old patient lying in the bed in no acute distress.  EYES: Pupils equal, round, reactive to light and accommodation. No scleral icterus. Extraocular muscles intact.  HEENT: Head atraumatic, normocephalic.  Oropharynx and nasopharynx clear.  NECK:  Supple, no jugular venous distention. No thyroid enlargement, no tenderness.  LUNGS: Normal breath sounds bilaterally, no wheezing, rales,rhonchi. No use of accessory muscles of respiration.  CARDIOVASCULAR: S1, S2 normal. No murmurs, rubs, or gallops.  ABDOMEN: Soft, non-tender, non-distended. Bowel sounds present. No organomegaly or mass.  EXTREMITIES: No pedal edema, cyanosis, or clubbing.  NEUROLOGIC: Cranial nerves II through XII are intact. No focal motor or sensory defecits b/l.  PSYCHIATRIC: The patient is alert and oriented x 3. Good affect.  SKIN: No obvious rash, lesion, or ulcer.   DATA REVIEW:   CBC  Recent Labs Lab 06/23/15 0516  WBC 8.1  HGB 11.4*  HCT 33.9*  PLT 211    Chemistries   Recent Labs Lab 06/22/15 0216 06/23/15 0516  NA 142 141  K 3.8 3.4*  CL 107 109  CO2 28 25  GLUCOSE 125* 99  BUN 28* 12  CREATININE 1.29* 0.83  CALCIUM 8.6* 8.3*  AST 27  --   ALT 22  --   ALKPHOS 48  --   BILITOT 0.4  --     Cardiac Enzymes No results for  input(s): TROPONINI in the last 168 hours.  Microbiology Results  Results for orders placed or performed in visit on 01/20/13  Influenza A&B Antigens [facility])     Status: None   Collection Time: 01/20/13 10:30 PM  Result Value Ref Range Status   Micro Text Report   Final       COMMENT                   NEGATIVE FOR INFLUENZA A (ANTIGEN ABSENT)   COMMENT                   NEGATIVE FOR INFLUENZA B (ANTIGEN ABSENT)   ANTIBIOTIC                                                        RADIOLOGY:  No results found.    Management plans discussed with the patient, family and they are in agreement.  CODE STATUS:     Code Status Orders        Start     Ordered   06/22/15 0756  Full code   Continuous     06/22/15 0755    Code Status History    Date Active Date Inactive Code Status Order ID Comments User Context   This patient has a current code status but no  historical code status.    Advance Directive Documentation        Most Recent Value   Type of Advance Directive  Living will   Pre-existing out of facility DNR order (yellow form or pink MOST form)     "MOST" Form in Place?        TOTAL TIME TAKING CARE OF THIS PATIENT: 40 minutes.    Houston Siren M.D on 06/23/2015 at 4:02 PM  Between 7am to 6pm - Pager - [phone]  After 6pm go to [email or web address] - password [email or web address] [facility]  [facility]  Office  [phone]  CC: Primary care physician; PROVIDER NOT IN SYSTEM

## 2015-07-23 ENCOUNTER — Encounter: Payer: Self-pay | Admitting: *Deleted

## 2015-07-25 ENCOUNTER — Ambulatory Visit: Payer: Medicare PPO | Admitting: Anesthesiology

## 2015-07-25 ENCOUNTER — Encounter: Payer: Self-pay | Admitting: *Deleted

## 2015-07-25 ENCOUNTER — Encounter
Admission: RE | Disposition: A | Discharge status: Home / Self Care | Payer: Self-pay | Source: Ambulatory Visit | Attending: Unknown Physician Specialty

## 2015-07-25 ENCOUNTER — Ambulatory Visit
Admission: RE | Admit: 2015-07-25 | Discharge: 2015-07-25 | Disposition: A | Discharge status: Home / Self Care | Payer: Medicare PPO | Source: Ambulatory Visit | Attending: Unknown Physician Specialty | Admitting: Unknown Physician Specialty

## 2015-07-25 DIAGNOSIS — K2970 Gastritis, unspecified, without bleeding: Secondary | ICD-10-CM | POA: Insufficient documentation

## 2015-07-25 DIAGNOSIS — Z79891 Long term (current) use of opiate analgesic: Secondary | ICD-10-CM | POA: Diagnosis not present

## 2015-07-25 DIAGNOSIS — R1310 Dysphagia, unspecified: Secondary | ICD-10-CM | POA: Insufficient documentation

## 2015-07-25 DIAGNOSIS — Z9889 Other specified postprocedural states: Secondary | ICD-10-CM | POA: Insufficient documentation

## 2015-07-25 DIAGNOSIS — E079 Disorder of thyroid, unspecified: Secondary | ICD-10-CM | POA: Diagnosis not present

## 2015-07-25 DIAGNOSIS — F419 Anxiety disorder, unspecified: Secondary | ICD-10-CM | POA: Insufficient documentation

## 2015-07-25 DIAGNOSIS — I10 Essential (primary) hypertension: Secondary | ICD-10-CM | POA: Insufficient documentation

## 2015-07-25 DIAGNOSIS — K319 Disease of stomach and duodenum, unspecified: Secondary | ICD-10-CM | POA: Diagnosis not present

## 2015-07-25 DIAGNOSIS — Z9049 Acquired absence of other specified parts of digestive tract: Secondary | ICD-10-CM | POA: Diagnosis not present

## 2015-07-25 DIAGNOSIS — Z880 Allergy status to penicillin: Secondary | ICD-10-CM | POA: Insufficient documentation

## 2015-07-25 DIAGNOSIS — Z79899 Other long term (current) drug therapy: Secondary | ICD-10-CM | POA: Diagnosis not present

## 2015-07-25 DIAGNOSIS — Z803 Family history of malignant neoplasm of breast: Secondary | ICD-10-CM | POA: Diagnosis not present

## 2015-07-25 DIAGNOSIS — R112 Nausea with vomiting, unspecified: Secondary | ICD-10-CM | POA: Diagnosis present

## 2015-07-25 HISTORY — PX: ESOPHAGOGASTRODUODENOSCOPY (EGD) WITH PROPOFOL: SHX5813

## 2015-07-25 SURGERY — ESOPHAGOGASTRODUODENOSCOPY (EGD) WITH PROPOFOL
Anesthesia: General

## 2015-07-25 MED ORDER — SODIUM CHLORIDE 0.9 % IV SOLN: INTRAVENOUS | Status: DC

## 2015-07-25 MED ORDER — PHENYLEPHRINE HCL 10 MG/ML IJ SOLN
INTRAMUSCULAR | Status: DC | PRN
  Administered 2015-07-25: 100 ug via INTRAVENOUS

## 2015-07-25 MED ORDER — SODIUM CHLORIDE 0.9 % IV SOLN
INTRAVENOUS | Status: DC
  Administered 2015-07-25: 1000 mL via INTRAVENOUS

## 2015-07-25 MED ORDER — FENTANYL CITRATE (PF) 100 MCG/2ML IJ SOLN
INTRAMUSCULAR | Status: DC | PRN
  Administered 2015-07-25: 50 ug via INTRAVENOUS

## 2015-07-25 MED ORDER — PROPOFOL 10 MG/ML IV BOLUS
INTRAVENOUS | Status: DC | PRN
  Administered 2015-07-25 (×2): 20 mg via INTRAVENOUS
  Administered 2015-07-25: 40 mg via INTRAVENOUS

## 2015-07-25 MED ORDER — PROPOFOL 500 MG/50ML IV EMUL
INTRAVENOUS | Status: DC | PRN
  Administered 2015-07-25: 130 ug/kg/min via INTRAVENOUS

## 2015-07-25 MED ORDER — EPHEDRINE SULFATE 50 MG/ML IJ SOLN
INTRAMUSCULAR | Status: DC | PRN
  Administered 2015-07-25: 5 mg via INTRAVENOUS

## 2015-07-25 MED ORDER — MIDAZOLAM HCL 2 MG/2ML IJ SOLN
INTRAMUSCULAR | Status: DC | PRN
  Administered 2015-07-25: 1 mg via INTRAVENOUS

## 2015-07-25 NOTE — Anesthesia Procedure Notes (Signed)
Date/Time: 07/25/2015 4:09 PM Performed by: Ginger CarneMICHELET, Nalina Yeatman Pre-anesthesia Checklist: Patient identified, Emergency Drugs available, Suction available, Patient being monitored and Timeout performed Patient Re-evaluated:Patient Re-evaluated prior to inductionOxygen Delivery Method: Nasal cannula

## 2015-07-25 NOTE — Transfer of Care (Signed)
Immediate Anesthesia Transfer of Care Note  Patient: Kevin Schmitt  Procedure(s) Performed: Procedure(s): ESOPHAGOGASTRODUODENOSCOPY (EGD) WITH PROPOFOL (N/A)  Patient Location: PACU  Anesthesia Type:General  Level of Consciousness: sedated  Airway & Oxygen Therapy: Patient Spontanous Breathing and Patient connected to nasal cannula oxygen  Post-op Assessment: Report given to RN and Post -op Vital signs reviewed and stable  Post vital signs: Reviewed and stable  Last Vitals:  Filed Vitals:   07/25/15 1502 07/25/15 1632  BP: 88/33 72/42  Pulse: 55 51  Temp: 37.3 C 37.1 C  Resp: 17 10    Last Pain:  Filed Vitals:   07/25/15 1633  PainSc: 3          Complications: No apparent anesthesia complications

## 2015-07-25 NOTE — H&P (Signed)
Primary Care Physician:  Pcp Not In System Primary Gastroenterologist:  Dr. Mechele CollinElliott  Pre-Procedure History & Physical: HPI:  Kevin BeaverShannon T Remlinger is a 45 y.o. male is here for an endoscopy.   Past Medical History  Diagnosis Date  . Gastritis   . Wrist fracture right  . Hypertension   . Thyroid disease   . Anxiety     Past Surgical History  Procedure Laterality Date  . Appendectomy    . Finger fracture surgery      Prior to Admission medications   Medication Sig Start Date End Date Taking? Authorizing Provider  ALPRAZolam Prudy Feeler(XANAX) 1 MG tablet Take 1 mg by mouth at bedtime.   Yes Historical Provider, MD  levothyroxine (SYNTHROID, LEVOTHROID) 50 MCG tablet Take 50 mcg by mouth daily before breakfast.   Yes Historical Provider, MD  methadone (DOLOPHINE) 10 MG tablet Take 10 mg by mouth every 8 (eight) hours.   Yes Historical Provider, MD  metoprolol succinate (TOPROL-XL) 50 MG 24 hr tablet Take 50 mg by mouth daily. Take with or immediately following a meal.   Yes Historical Provider, MD  oxycodone (ROXICODONE) 30 MG immediate release tablet Take 30 mg by mouth every 4 (four) hours as needed for pain.   Yes Historical Provider, MD  pantoprazole (PROTONIX) 40 MG tablet Take 40 mg by mouth 2 (two) times daily.   Yes Historical Provider, MD  rosuvastatin (CRESTOR) 40 MG tablet Take 40 mg by mouth daily.   Yes Historical Provider, MD  sucralfate (CARAFATE) 1 GM/10ML suspension Take 10 mLs (1 g total) by mouth 4 (four) times daily -  with meals and at bedtime. 06/23/15  Yes Houston SirenVivek J Sainani, MD  lisinopril (PRINIVIL,ZESTRIL) 40 MG tablet Take 40 mg by mouth daily.    Historical Provider, MD    Allergies as of 06/29/2015 - Review Complete 06/22/2015  Allergen Reaction Noted  . Penicillins Anaphylaxis and Hives 12/24/2014    Family History  Problem Relation Age of Onset  . Breast cancer Maternal Grandmother     Social History   Social History  . Marital Status: Single    Spouse  Name: N/A  . Number of Children: N/A  . Years of Education: N/A   Occupational History  . Not on file.   Social History Main Topics  . Smoking status: Never Smoker   . Smokeless tobacco: Not on file  . Alcohol Use: No  . Drug Use: Yes    Special: Marijuana  . Sexual Activity: Not on file   Other Topics Concern  . Not on file   Social History Narrative    Review of Systems: See HPI, otherwise negative ROS  Physical Exam: BP 95/50 mmHg  Pulse 50  Temp(Src) 98.8 F (37.1 C) (Tympanic)  Resp 11  Ht 5\' 10"  (1.778 m)  Wt 95.255 kg (210 lb)  BMI 30.13 kg/m2  SpO2 99% General:   Alert,  pleasant and cooperative in NAD Head:  Normocephalic and atraumatic. Neck:  Supple; no masses or thyromegaly. Lungs:  Clear throughout to auscultation.    Heart:  Regular rate and rhythm. Abdomen:  Soft, nontender and nondistended. Normal bowel sounds, without guarding, and without rebound.   Neurologic:  Alert and  oriented x4;  grossly normal neurologically.  Impression/Plan: Kevin Schmitt is here for an endoscopy to be performed for Vomiting   Risks, benefits, limitations, and alternatives regarding  endoscopy have been reviewed with the patient.  Questions have been answered.  All parties  agreeable.   Lynnae PrudeELLIOTT, Paiton Boultinghouse, MD  07/25/2015, 5:06 PM

## 2015-07-25 NOTE — Op Note (Signed)
[facility] Gastroenterology Patient Name: Kevin FannyShannon Stager Procedure Date: 07/25/2015 4:07 PM MRN: [id number] Account #: [id number] Date of Birth: Sep 05, 1970 Admit Type: Outpatient Age: [age] [facility] Gender: Male Note Status: Finalized Procedure:            Upper GI endoscopy Indications:          Follow-up of intestinal metaplasia Providers:            Scot Junobert T. Elliott, MD Referring MD:         Sharlot GowdaEdward McCarthy, MD Medicines:            Propofol per Anesthesia Complications:        No immediate complications. Procedure:            Pre-Anesthesia Assessment:                       - After reviewing the risks and benefits, the patient                        was deemed in satisfactory condition to undergo the                        procedure.                       After obtaining informed consent, the endoscope was                        passed under direct vision. Throughout the procedure,                        the patient's blood pressure, pulse, and oxygen                        saturations were monitored continuously. The                        Colonoscope was introduced through the mouth, and                        advanced to the second part of duodenum. The upper GI                        endoscopy was accomplished without difficulty. The                        patient tolerated the procedure well. Findings:      The examined esophagus was normal.      Diffuse and patchy mild inflammation characterized by erythema and       granularity was found in the gastric body and in the gastric antrum.       Biopsies were taken with a cold forceps for histology.      The examined duodenum was normal.      A small hiatal hernia was present. Impression:           - Normal esophagus.                       - Gastritis. Biopsied.                       -  Normal examined duodenum. Recommendation:       - Await pathology results. Scot Junobert T Elliott, MD 07/25/2015  4:29:52 PM This report has been signed electronically. Number of Addenda: 0 Note Initiated On: 07/25/2015 4:07 PM      [facility]

## 2015-07-25 NOTE — Anesthesia Preprocedure Evaluation (Signed)
Anesthesia Evaluation  Patient identified by MRN, date of birth, ID band Patient awake    Reviewed: Allergy & Precautions, H&P , NPO status , Patient's Chart, lab work & pertinent test results, reviewed documented beta blocker date and time   History of Anesthesia Complications Negative for: history of anesthetic complications  Airway Mallampati: I  TM Distance: >3 FB Neck ROM: full    Dental no notable dental hx. (+) Teeth Intact   Pulmonary neg pulmonary ROS,    Pulmonary exam normal breath sounds clear to auscultation       Cardiovascular Exercise Tolerance: Good hypertension, (-) angina(-) CAD, (-) Past MI, (-) Cardiac Stents and (-) CABG Normal cardiovascular exam(-) dysrhythmias (-) Valvular Problems/Murmurs Rhythm:regular Rate:Normal     Neuro/Psych negative neurological ROS  negative psych ROS   GI/Hepatic negative GI ROS, Neg liver ROS,   Endo/Other  neg diabetesHypothyroidism   Renal/GU negative Renal ROS  negative genitourinary   Musculoskeletal   Abdominal   Peds  Hematology negative hematology ROS (+)   Anesthesia Other Findings Past Medical History:   Gastritis                                                    Wrist fracture                                  right        Hypertension                                                 Thyroid disease                                              Anxiety                                                      Reproductive/Obstetrics negative OB ROS                             Anesthesia Physical Anesthesia Plan  ASA: II  Anesthesia Plan: General   Post-op Pain Management:    Induction:   Airway Management Planned:   Additional Equipment:   Intra-op Plan:   Post-operative Plan:   Informed Consent: I have reviewed the patients History and Physical, chart, labs and discussed the procedure including the risks, benefits  and alternatives for the proposed anesthesia with the patient or authorized representative who has indicated his/her understanding and acceptance.   Dental Advisory Given  Plan Discussed with: Anesthesiologist, CRNA and Surgeon  Anesthesia Plan Comments:         Anesthesia Quick Evaluation

## 2015-07-26 ENCOUNTER — Encounter: Payer: Self-pay | Admitting: Unknown Physician Specialty

## 2015-07-26 NOTE — Anesthesia Postprocedure Evaluation (Signed)
Anesthesia Post Note  Patient: Kevin Schmitt  Procedure(s) Performed: Procedure(s) (LRB): ESOPHAGOGASTRODUODENOSCOPY (EGD) WITH PROPOFOL (N/A)  Patient location during evaluation: Endoscopy Anesthesia Type: General Level of consciousness: awake and alert Pain management: pain level controlled Vital Signs Assessment: post-procedure vital signs reviewed and stable Respiratory status: spontaneous breathing, nonlabored ventilation, respiratory function stable and patient connected to nasal cannula oxygen Cardiovascular status: blood pressure returned to baseline and stable Postop Assessment: no signs of nausea or vomiting Anesthetic complications: no    Last Vitals:  Filed Vitals:   07/25/15 1653 07/25/15 1703  BP: 104/73 95/50  Pulse: 51 50  Temp:    Resp: 19 11    Last Pain:  Filed Vitals:   07/26/15 0737  PainSc: 0-No pain                 Lenard SimmerAndrew Cerenity Goshorn

## 2015-07-30 LAB — SURGICAL PATHOLOGY

## 2017-07-11 ENCOUNTER — Other Ambulatory Visit: Payer: Self-pay

## 2017-07-11 ENCOUNTER — Encounter: Payer: Self-pay | Admitting: Emergency Medicine

## 2017-07-11 DIAGNOSIS — Y998 Other external cause status: Secondary | ICD-10-CM | POA: Insufficient documentation

## 2017-07-11 DIAGNOSIS — S39012A Strain of muscle, fascia and tendon of lower back, initial encounter: Secondary | ICD-10-CM | POA: Diagnosis not present

## 2017-07-11 DIAGNOSIS — Y92410 Unspecified street and highway as the place of occurrence of the external cause: Secondary | ICD-10-CM | POA: Diagnosis not present

## 2017-07-11 DIAGNOSIS — Z79899 Other long term (current) drug therapy: Secondary | ICD-10-CM | POA: Diagnosis not present

## 2017-07-11 DIAGNOSIS — I10 Essential (primary) hypertension: Secondary | ICD-10-CM | POA: Insufficient documentation

## 2017-07-11 DIAGNOSIS — Y9389 Activity, other specified: Secondary | ICD-10-CM | POA: Insufficient documentation

## 2017-07-11 DIAGNOSIS — S3992XA Unspecified injury of lower back, initial encounter: Secondary | ICD-10-CM | POA: Diagnosis present

## 2017-07-11 NOTE — ED Triage Notes (Addendum)
Pt says he has a history of chronic back pain; was rearended last night and his lower back pain has been worse since; was unable to sleep last night due to pain; pt ambulatory with steady gait; carrying a cane-says if he back locks up he'll need the cane but is not currently using; has been using ice and TENS unit with no relief

## 2017-07-12 ENCOUNTER — Emergency Department
Admission: EM | Admit: 2017-07-12 | Discharge: 2017-07-12 | Disposition: A | Discharge status: Home / Self Care | Payer: Medicare PPO | Attending: Emergency Medicine | Admitting: Emergency Medicine

## 2017-07-12 ENCOUNTER — Emergency Department: Payer: Medicare PPO

## 2017-07-12 DIAGNOSIS — M545 Low back pain: Secondary | ICD-10-CM

## 2017-07-12 DIAGNOSIS — G8929 Other chronic pain: Secondary | ICD-10-CM

## 2017-07-12 DIAGNOSIS — S39012A Strain of muscle, fascia and tendon of lower back, initial encounter: Secondary | ICD-10-CM

## 2017-07-12 HISTORY — DX: Dorsalgia, unspecified: M54.9

## 2017-07-12 HISTORY — DX: Other chronic pain: G89.29

## 2017-07-12 MED ORDER — PREDNISONE 20 MG PO TABS
60.0000 mg | ORAL_TABLET | Freq: Once | ORAL | Status: AC
  Administered 2017-07-12: 60 mg via ORAL
  Filled 2017-07-12: qty 6

## 2017-07-12 MED ORDER — OXYCODONE HCL 5 MG PO TABS
15.0000 mg | ORAL_TABLET | Freq: Once | ORAL | Status: AC
  Administered 2017-07-12: 15 mg via ORAL
  Filled 2017-07-12: qty 3

## 2017-07-12 MED ORDER — IBUPROFEN 600 MG PO TABS
600.0000 mg | ORAL_TABLET | Freq: Three times a day (TID) | ORAL | 0 refills | Status: AC | PRN
Start: 2017-07-12 — End: ?

## 2017-07-12 MED ORDER — KETOROLAC TROMETHAMINE 30 MG/ML IJ SOLN
30.0000 mg | Freq: Once | INTRAMUSCULAR | Status: AC
  Administered 2017-07-12: 30 mg via INTRAMUSCULAR
  Filled 2017-07-12: qty 1

## 2017-07-12 MED ORDER — PREDNISONE 50 MG PO TABS: 50.0000 mg | ORAL_TABLET | Freq: Every day | ORAL | 0 refills | Status: AC

## 2017-07-12 NOTE — ED Notes (Signed)
Patient returned from XR. 
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
## 2021-01-24 DIAGNOSIS — D490 Neoplasm of unspecified behavior of digestive system: Secondary | ICD-10-CM | POA: Diagnosis not present

## 2021-01-24 DIAGNOSIS — E039 Hypothyroidism, unspecified: Secondary | ICD-10-CM | POA: Diagnosis not present

## 2021-01-24 DIAGNOSIS — G894 Chronic pain syndrome: Secondary | ICD-10-CM | POA: Diagnosis not present

## 2021-01-24 DIAGNOSIS — M5126 Other intervertebral disc displacement, lumbar region: Secondary | ICD-10-CM | POA: Diagnosis not present

## 2021-01-24 DIAGNOSIS — I10 Essential (primary) hypertension: Secondary | ICD-10-CM | POA: Diagnosis not present

## 2021-01-24 DIAGNOSIS — E291 Testicular hypofunction: Secondary | ICD-10-CM | POA: Diagnosis not present

## 2021-01-24 DIAGNOSIS — E782 Mixed hyperlipidemia: Secondary | ICD-10-CM | POA: Diagnosis not present

## 2021-01-24 DIAGNOSIS — E663 Overweight: Secondary | ICD-10-CM | POA: Diagnosis not present

## 2021-02-22 DIAGNOSIS — M5126 Other intervertebral disc displacement, lumbar region: Secondary | ICD-10-CM | POA: Diagnosis not present

## 2021-02-22 DIAGNOSIS — Z6828 Body mass index (BMI) 28.0-28.9, adult: Secondary | ICD-10-CM | POA: Diagnosis not present

## 2021-02-22 DIAGNOSIS — G894 Chronic pain syndrome: Secondary | ICD-10-CM | POA: Diagnosis not present

## 2021-03-22 DIAGNOSIS — G894 Chronic pain syndrome: Secondary | ICD-10-CM | POA: Diagnosis not present

## 2021-03-22 DIAGNOSIS — Z6828 Body mass index (BMI) 28.0-28.9, adult: Secondary | ICD-10-CM | POA: Diagnosis not present

## 2021-03-22 DIAGNOSIS — E663 Overweight: Secondary | ICD-10-CM | POA: Diagnosis not present

## 2021-03-22 DIAGNOSIS — M5126 Other intervertebral disc displacement, lumbar region: Secondary | ICD-10-CM | POA: Diagnosis not present

## 2021-04-22 DIAGNOSIS — Z6828 Body mass index (BMI) 28.0-28.9, adult: Secondary | ICD-10-CM | POA: Diagnosis not present

## 2021-04-22 DIAGNOSIS — G894 Chronic pain syndrome: Secondary | ICD-10-CM | POA: Diagnosis not present

## 2021-04-22 DIAGNOSIS — R0789 Other chest pain: Secondary | ICD-10-CM | POA: Diagnosis not present

## 2021-04-22 DIAGNOSIS — M5126 Other intervertebral disc displacement, lumbar region: Secondary | ICD-10-CM | POA: Diagnosis not present

## 2021-04-22 DIAGNOSIS — E663 Overweight: Secondary | ICD-10-CM | POA: Diagnosis not present

## 2021-05-20 DIAGNOSIS — R110 Nausea: Secondary | ICD-10-CM | POA: Diagnosis not present

## 2021-05-20 DIAGNOSIS — L739 Follicular disorder, unspecified: Secondary | ICD-10-CM | POA: Diagnosis not present

## 2021-05-20 DIAGNOSIS — I10 Essential (primary) hypertension: Secondary | ICD-10-CM | POA: Diagnosis not present

## 2021-05-20 DIAGNOSIS — E782 Mixed hyperlipidemia: Secondary | ICD-10-CM | POA: Diagnosis not present

## 2021-05-20 DIAGNOSIS — G894 Chronic pain syndrome: Secondary | ICD-10-CM | POA: Diagnosis not present

## 2021-05-20 DIAGNOSIS — E039 Hypothyroidism, unspecified: Secondary | ICD-10-CM | POA: Diagnosis not present

## 2021-05-20 DIAGNOSIS — M5126 Other intervertebral disc displacement, lumbar region: Secondary | ICD-10-CM | POA: Diagnosis not present

## 2021-05-20 DIAGNOSIS — E663 Overweight: Secondary | ICD-10-CM | POA: Diagnosis not present

## 2021-06-20 DIAGNOSIS — E039 Hypothyroidism, unspecified: Secondary | ICD-10-CM | POA: Diagnosis not present

## 2021-06-20 DIAGNOSIS — M5126 Other intervertebral disc displacement, lumbar region: Secondary | ICD-10-CM | POA: Diagnosis not present

## 2021-06-20 DIAGNOSIS — G894 Chronic pain syndrome: Secondary | ICD-10-CM | POA: Diagnosis not present

## 2021-06-20 DIAGNOSIS — Z125 Encounter for screening for malignant neoplasm of prostate: Secondary | ICD-10-CM | POA: Diagnosis not present

## 2021-06-20 DIAGNOSIS — L739 Follicular disorder, unspecified: Secondary | ICD-10-CM | POA: Diagnosis not present

## 2021-06-20 DIAGNOSIS — E663 Overweight: Secondary | ICD-10-CM | POA: Diagnosis not present

## 2021-06-20 DIAGNOSIS — E782 Mixed hyperlipidemia: Secondary | ICD-10-CM | POA: Diagnosis not present

## 2021-07-18 DIAGNOSIS — M5126 Other intervertebral disc displacement, lumbar region: Secondary | ICD-10-CM | POA: Diagnosis not present

## 2021-07-18 DIAGNOSIS — E663 Overweight: Secondary | ICD-10-CM | POA: Diagnosis not present

## 2021-07-18 DIAGNOSIS — E039 Hypothyroidism, unspecified: Secondary | ICD-10-CM | POA: Diagnosis not present

## 2021-07-18 DIAGNOSIS — N529 Male erectile dysfunction, unspecified: Secondary | ICD-10-CM | POA: Diagnosis not present

## 2021-07-18 DIAGNOSIS — N1831 Chronic kidney disease, stage 3a: Secondary | ICD-10-CM | POA: Diagnosis not present

## 2021-07-18 DIAGNOSIS — G894 Chronic pain syndrome: Secondary | ICD-10-CM | POA: Diagnosis not present

## 2021-07-18 DIAGNOSIS — E782 Mixed hyperlipidemia: Secondary | ICD-10-CM | POA: Diagnosis not present

## 2021-08-15 DIAGNOSIS — M5126 Other intervertebral disc displacement, lumbar region: Secondary | ICD-10-CM | POA: Diagnosis not present

## 2021-08-15 DIAGNOSIS — N529 Male erectile dysfunction, unspecified: Secondary | ICD-10-CM | POA: Diagnosis not present

## 2021-08-15 DIAGNOSIS — G894 Chronic pain syndrome: Secondary | ICD-10-CM | POA: Diagnosis not present

## 2021-08-15 DIAGNOSIS — N1831 Chronic kidney disease, stage 3a: Secondary | ICD-10-CM | POA: Diagnosis not present

## 2021-08-15 DIAGNOSIS — E663 Overweight: Secondary | ICD-10-CM | POA: Diagnosis not present

## 2021-09-12 DIAGNOSIS — Z6830 Body mass index (BMI) 30.0-30.9, adult: Secondary | ICD-10-CM | POA: Diagnosis not present

## 2021-09-12 DIAGNOSIS — Z23 Encounter for immunization: Secondary | ICD-10-CM | POA: Diagnosis not present

## 2021-09-12 DIAGNOSIS — I10 Essential (primary) hypertension: Secondary | ICD-10-CM | POA: Diagnosis not present

## 2021-09-12 DIAGNOSIS — M5126 Other intervertebral disc displacement, lumbar region: Secondary | ICD-10-CM | POA: Diagnosis not present

## 2021-09-12 DIAGNOSIS — G894 Chronic pain syndrome: Secondary | ICD-10-CM | POA: Diagnosis not present

## 2021-10-09 DIAGNOSIS — M5126 Other intervertebral disc displacement, lumbar region: Secondary | ICD-10-CM | POA: Diagnosis not present

## 2021-10-09 DIAGNOSIS — Z6830 Body mass index (BMI) 30.0-30.9, adult: Secondary | ICD-10-CM | POA: Diagnosis not present

## 2021-10-09 DIAGNOSIS — E782 Mixed hyperlipidemia: Secondary | ICD-10-CM | POA: Diagnosis not present

## 2021-10-09 DIAGNOSIS — G894 Chronic pain syndrome: Secondary | ICD-10-CM | POA: Diagnosis not present

## 2021-10-09 DIAGNOSIS — I10 Essential (primary) hypertension: Secondary | ICD-10-CM | POA: Diagnosis not present

## 2021-10-09 DIAGNOSIS — E039 Hypothyroidism, unspecified: Secondary | ICD-10-CM | POA: Diagnosis not present

## 2021-11-06 DIAGNOSIS — M5126 Other intervertebral disc displacement, lumbar region: Secondary | ICD-10-CM | POA: Diagnosis not present

## 2021-11-06 DIAGNOSIS — G894 Chronic pain syndrome: Secondary | ICD-10-CM | POA: Diagnosis not present

## 2021-11-06 DIAGNOSIS — E291 Testicular hypofunction: Secondary | ICD-10-CM | POA: Diagnosis not present

## 2021-12-05 DIAGNOSIS — Z6830 Body mass index (BMI) 30.0-30.9, adult: Secondary | ICD-10-CM | POA: Diagnosis not present

## 2021-12-05 DIAGNOSIS — G894 Chronic pain syndrome: Secondary | ICD-10-CM | POA: Diagnosis not present

## 2021-12-05 DIAGNOSIS — M5126 Other intervertebral disc displacement, lumbar region: Secondary | ICD-10-CM | POA: Diagnosis not present

## 2021-12-05 DIAGNOSIS — E668 Other obesity: Secondary | ICD-10-CM | POA: Diagnosis not present

## 2022-01-02 DIAGNOSIS — L739 Follicular disorder, unspecified: Secondary | ICD-10-CM | POA: Diagnosis not present

## 2022-01-02 DIAGNOSIS — G894 Chronic pain syndrome: Secondary | ICD-10-CM | POA: Diagnosis not present

## 2022-01-02 DIAGNOSIS — M5126 Other intervertebral disc displacement, lumbar region: Secondary | ICD-10-CM | POA: Diagnosis not present

## 2022-01-02 DIAGNOSIS — E663 Overweight: Secondary | ICD-10-CM | POA: Diagnosis not present

## 2022-01-02 DIAGNOSIS — I10 Essential (primary) hypertension: Secondary | ICD-10-CM | POA: Diagnosis not present

## 2022-01-02 DIAGNOSIS — K219 Gastro-esophageal reflux disease without esophagitis: Secondary | ICD-10-CM | POA: Diagnosis not present

## 2022-01-02 DIAGNOSIS — Z6829 Body mass index (BMI) 29.0-29.9, adult: Secondary | ICD-10-CM | POA: Diagnosis not present

## 2022-01-30 DIAGNOSIS — E782 Mixed hyperlipidemia: Secondary | ICD-10-CM | POA: Diagnosis not present

## 2022-01-30 DIAGNOSIS — E039 Hypothyroidism, unspecified: Secondary | ICD-10-CM | POA: Diagnosis not present

## 2022-01-30 DIAGNOSIS — G894 Chronic pain syndrome: Secondary | ICD-10-CM | POA: Diagnosis not present

## 2022-01-30 DIAGNOSIS — M5126 Other intervertebral disc displacement, lumbar region: Secondary | ICD-10-CM | POA: Diagnosis not present

## 2022-01-30 DIAGNOSIS — E663 Overweight: Secondary | ICD-10-CM | POA: Diagnosis not present

## 2022-01-30 DIAGNOSIS — Z6829 Body mass index (BMI) 29.0-29.9, adult: Secondary | ICD-10-CM | POA: Diagnosis not present

## 2022-02-25 DIAGNOSIS — G894 Chronic pain syndrome: Secondary | ICD-10-CM | POA: Diagnosis not present

## 2022-02-25 DIAGNOSIS — R7303 Prediabetes: Secondary | ICD-10-CM | POA: Diagnosis not present

## 2022-02-25 DIAGNOSIS — M5126 Other intervertebral disc displacement, lumbar region: Secondary | ICD-10-CM | POA: Diagnosis not present

## 2022-02-25 DIAGNOSIS — E782 Mixed hyperlipidemia: Secondary | ICD-10-CM | POA: Diagnosis not present

## 2022-02-25 DIAGNOSIS — Z6830 Body mass index (BMI) 30.0-30.9, adult: Secondary | ICD-10-CM | POA: Diagnosis not present

## 2022-02-25 DIAGNOSIS — E039 Hypothyroidism, unspecified: Secondary | ICD-10-CM | POA: Diagnosis not present

## 2022-02-25 DIAGNOSIS — E6609 Other obesity due to excess calories: Secondary | ICD-10-CM | POA: Diagnosis not present

## 2022-03-27 DIAGNOSIS — M5126 Other intervertebral disc displacement, lumbar region: Secondary | ICD-10-CM | POA: Diagnosis not present

## 2022-03-27 DIAGNOSIS — Z6830 Body mass index (BMI) 30.0-30.9, adult: Secondary | ICD-10-CM | POA: Diagnosis not present

## 2022-03-27 DIAGNOSIS — E6609 Other obesity due to excess calories: Secondary | ICD-10-CM | POA: Diagnosis not present

## 2022-03-27 DIAGNOSIS — G894 Chronic pain syndrome: Secondary | ICD-10-CM | POA: Diagnosis not present

## 2022-03-28 DIAGNOSIS — M5126 Other intervertebral disc displacement, lumbar region: Secondary | ICD-10-CM | POA: Diagnosis not present

## 2022-04-24 DIAGNOSIS — I10 Essential (primary) hypertension: Secondary | ICD-10-CM | POA: Diagnosis not present

## 2022-04-24 DIAGNOSIS — Z6830 Body mass index (BMI) 30.0-30.9, adult: Secondary | ICD-10-CM | POA: Diagnosis not present

## 2022-04-24 DIAGNOSIS — E6609 Other obesity due to excess calories: Secondary | ICD-10-CM | POA: Diagnosis not present

## 2022-04-24 DIAGNOSIS — M5126 Other intervertebral disc displacement, lumbar region: Secondary | ICD-10-CM | POA: Diagnosis not present

## 2022-04-24 DIAGNOSIS — G894 Chronic pain syndrome: Secondary | ICD-10-CM | POA: Diagnosis not present

## 2022-07-01 DIAGNOSIS — I10 Essential (primary) hypertension: Secondary | ICD-10-CM | POA: Diagnosis not present

## 2022-07-01 DIAGNOSIS — M5126 Other intervertebral disc displacement, lumbar region: Secondary | ICD-10-CM | POA: Diagnosis not present

## 2022-07-01 DIAGNOSIS — E6609 Other obesity due to excess calories: Secondary | ICD-10-CM | POA: Diagnosis not present

## 2022-07-01 DIAGNOSIS — Z6830 Body mass index (BMI) 30.0-30.9, adult: Secondary | ICD-10-CM | POA: Diagnosis not present

## 2022-07-01 DIAGNOSIS — G894 Chronic pain syndrome: Secondary | ICD-10-CM | POA: Diagnosis not present

## 2022-07-30 DIAGNOSIS — Z1211 Encounter for screening for malignant neoplasm of colon: Secondary | ICD-10-CM | POA: Diagnosis not present

## 2022-07-30 DIAGNOSIS — G894 Chronic pain syndrome: Secondary | ICD-10-CM | POA: Diagnosis not present

## 2022-07-30 DIAGNOSIS — I10 Essential (primary) hypertension: Secondary | ICD-10-CM | POA: Diagnosis not present

## 2022-07-30 DIAGNOSIS — E6609 Other obesity due to excess calories: Secondary | ICD-10-CM | POA: Diagnosis not present

## 2022-07-30 DIAGNOSIS — E039 Hypothyroidism, unspecified: Secondary | ICD-10-CM | POA: Diagnosis not present

## 2022-07-30 DIAGNOSIS — E291 Testicular hypofunction: Secondary | ICD-10-CM | POA: Diagnosis not present

## 2022-07-30 DIAGNOSIS — Z125 Encounter for screening for malignant neoplasm of prostate: Secondary | ICD-10-CM | POA: Diagnosis not present

## 2022-07-30 DIAGNOSIS — M5126 Other intervertebral disc displacement, lumbar region: Secondary | ICD-10-CM | POA: Diagnosis not present

## 2022-07-30 DIAGNOSIS — E782 Mixed hyperlipidemia: Secondary | ICD-10-CM | POA: Diagnosis not present

## 2022-09-03 DIAGNOSIS — G894 Chronic pain syndrome: Secondary | ICD-10-CM | POA: Diagnosis not present

## 2022-09-03 DIAGNOSIS — E6609 Other obesity due to excess calories: Secondary | ICD-10-CM | POA: Diagnosis not present

## 2022-10-09 DIAGNOSIS — I10 Essential (primary) hypertension: Secondary | ICD-10-CM | POA: Diagnosis not present

## 2022-10-09 DIAGNOSIS — Z6835 Body mass index (BMI) 35.0-35.9, adult: Secondary | ICD-10-CM | POA: Diagnosis not present

## 2022-10-09 DIAGNOSIS — E6609 Other obesity due to excess calories: Secondary | ICD-10-CM | POA: Diagnosis not present

## 2022-10-09 DIAGNOSIS — G894 Chronic pain syndrome: Secondary | ICD-10-CM | POA: Diagnosis not present

## 2022-10-09 DIAGNOSIS — M5126 Other intervertebral disc displacement, lumbar region: Secondary | ICD-10-CM | POA: Diagnosis not present

## 2022-10-09 DIAGNOSIS — Z23 Encounter for immunization: Secondary | ICD-10-CM | POA: Diagnosis not present

## 2022-12-09 DIAGNOSIS — M5126 Other intervertebral disc displacement, lumbar region: Secondary | ICD-10-CM | POA: Diagnosis not present

## 2022-12-09 DIAGNOSIS — G894 Chronic pain syndrome: Secondary | ICD-10-CM | POA: Diagnosis not present

## 2022-12-09 DIAGNOSIS — E291 Testicular hypofunction: Secondary | ICD-10-CM | POA: Diagnosis not present

## 2023-01-08 DIAGNOSIS — E6609 Other obesity due to excess calories: Secondary | ICD-10-CM | POA: Diagnosis not present

## 2023-01-08 DIAGNOSIS — M5126 Other intervertebral disc displacement, lumbar region: Secondary | ICD-10-CM | POA: Diagnosis not present

## 2023-01-08 DIAGNOSIS — E291 Testicular hypofunction: Secondary | ICD-10-CM | POA: Diagnosis not present

## 2023-01-08 DIAGNOSIS — G894 Chronic pain syndrome: Secondary | ICD-10-CM | POA: Diagnosis not present

## 2023-01-08 DIAGNOSIS — Z6836 Body mass index (BMI) 36.0-36.9, adult: Secondary | ICD-10-CM | POA: Diagnosis not present

## 2023-01-08 DIAGNOSIS — K219 Gastro-esophageal reflux disease without esophagitis: Secondary | ICD-10-CM | POA: Diagnosis not present

## 2023-02-09 DIAGNOSIS — E66812 Obesity, class 2: Secondary | ICD-10-CM | POA: Diagnosis not present

## 2023-02-09 DIAGNOSIS — E782 Mixed hyperlipidemia: Secondary | ICD-10-CM | POA: Diagnosis not present

## 2023-02-09 DIAGNOSIS — E039 Hypothyroidism, unspecified: Secondary | ICD-10-CM | POA: Diagnosis not present

## 2023-02-09 DIAGNOSIS — G894 Chronic pain syndrome: Secondary | ICD-10-CM | POA: Diagnosis not present

## 2023-02-09 DIAGNOSIS — E291 Testicular hypofunction: Secondary | ICD-10-CM | POA: Diagnosis not present

## 2023-02-09 DIAGNOSIS — I10 Essential (primary) hypertension: Secondary | ICD-10-CM | POA: Diagnosis not present

## 2023-02-09 DIAGNOSIS — K219 Gastro-esophageal reflux disease without esophagitis: Secondary | ICD-10-CM | POA: Diagnosis not present

## 2023-02-09 DIAGNOSIS — M5126 Other intervertebral disc displacement, lumbar region: Secondary | ICD-10-CM | POA: Diagnosis not present
